# Patient Record
Sex: Female | Born: 1940 | Race: White | Hispanic: No | Marital: Married | Smoking: Former smoker
Health system: Southern US, Community
[De-identification: ages and names within clinical notes are randomized; demographics above are authoritative.]

## PROBLEM LIST (undated history)

## (undated) DIAGNOSIS — E039 Hypothyroidism, unspecified: Secondary | ICD-10-CM

## (undated) DIAGNOSIS — N189 Chronic kidney disease, unspecified: Secondary | ICD-10-CM

## (undated) DIAGNOSIS — C4490 Unspecified malignant neoplasm of skin, unspecified: Secondary | ICD-10-CM

## (undated) DIAGNOSIS — Z889 Allergy status to unspecified drugs, medicaments and biological substances status: Secondary | ICD-10-CM

## (undated) DIAGNOSIS — Z8669 Personal history of other diseases of the nervous system and sense organs: Secondary | ICD-10-CM

## (undated) DIAGNOSIS — I10 Essential (primary) hypertension: Secondary | ICD-10-CM

## (undated) DIAGNOSIS — E7800 Pure hypercholesterolemia, unspecified: Secondary | ICD-10-CM

## (undated) DIAGNOSIS — N329 Bladder disorder, unspecified: Secondary | ICD-10-CM

## (undated) DIAGNOSIS — K635 Polyp of colon: Secondary | ICD-10-CM

## (undated) HISTORY — PX: UPPER GI ENDOSCOPY: SHX6162

## (undated) HISTORY — DX: Unspecified malignant neoplasm of skin, unspecified: C44.90

## (undated) HISTORY — PX: TUBAL LIGATION: SHX77

## (undated) HISTORY — PX: COLONOSCOPY W/ POLYPECTOMY: SHX1380

## (undated) HISTORY — DX: Chronic kidney disease, unspecified: N18.9

---

## 1971-02-22 HISTORY — PX: TUBAL LIGATION: SHX77

## 1971-02-22 HISTORY — PX: APPENDECTOMY: SHX54

## 2000-06-12 ENCOUNTER — Ambulatory Visit (HOSPITAL_COMMUNITY): Admission: RE | Admit: 2000-06-12 | Discharge: 2000-06-12 | Payer: Self-pay | Admitting: Internal Medicine

## 2000-10-16 ENCOUNTER — Encounter: Payer: Self-pay | Admitting: Family Medicine

## 2000-10-16 ENCOUNTER — Ambulatory Visit (HOSPITAL_COMMUNITY): Admission: RE | Admit: 2000-10-16 | Discharge: 2000-10-16 | Payer: Self-pay | Admitting: Family Medicine

## 2000-10-26 ENCOUNTER — Ambulatory Visit (HOSPITAL_COMMUNITY): Admission: RE | Admit: 2000-10-26 | Discharge: 2000-10-26 | Payer: Self-pay | Admitting: Family Medicine

## 2000-10-26 ENCOUNTER — Encounter: Payer: Self-pay | Admitting: Family Medicine

## 2000-11-21 ENCOUNTER — Encounter: Payer: Self-pay | Admitting: Family Medicine

## 2000-11-21 ENCOUNTER — Ambulatory Visit (HOSPITAL_COMMUNITY): Admission: RE | Admit: 2000-11-21 | Discharge: 2000-11-21 | Payer: Self-pay | Admitting: Family Medicine

## 2001-04-10 ENCOUNTER — Ambulatory Visit (HOSPITAL_COMMUNITY): Admission: RE | Admit: 2001-04-10 | Discharge: 2001-04-10 | Payer: Self-pay | Admitting: Family Medicine

## 2001-04-10 ENCOUNTER — Encounter: Payer: Self-pay | Admitting: Family Medicine

## 2001-04-13 ENCOUNTER — Ambulatory Visit (HOSPITAL_COMMUNITY): Admission: RE | Admit: 2001-04-13 | Discharge: 2001-04-13 | Payer: Self-pay | Admitting: Family Medicine

## 2001-04-13 ENCOUNTER — Encounter: Payer: Self-pay | Admitting: Family Medicine

## 2001-04-27 ENCOUNTER — Ambulatory Visit (HOSPITAL_COMMUNITY): Admission: RE | Admit: 2001-04-27 | Discharge: 2001-04-27 | Payer: Self-pay | Admitting: Internal Medicine

## 2001-11-21 ENCOUNTER — Ambulatory Visit (HOSPITAL_COMMUNITY): Admission: RE | Admit: 2001-11-21 | Discharge: 2001-11-21 | Payer: Self-pay | Admitting: Family Medicine

## 2001-11-21 ENCOUNTER — Encounter: Payer: Self-pay | Admitting: Family Medicine

## 2002-05-23 ENCOUNTER — Ambulatory Visit (HOSPITAL_COMMUNITY): Admission: RE | Admit: 2002-05-23 | Discharge: 2002-05-23 | Payer: Self-pay | Admitting: Family Medicine

## 2002-05-23 ENCOUNTER — Encounter: Payer: Self-pay | Admitting: Family Medicine

## 2003-07-29 ENCOUNTER — Ambulatory Visit (HOSPITAL_COMMUNITY): Admission: RE | Admit: 2003-07-29 | Discharge: 2003-07-29 | Payer: Self-pay | Admitting: Family Medicine

## 2004-12-16 ENCOUNTER — Ambulatory Visit (HOSPITAL_COMMUNITY): Admission: RE | Admit: 2004-12-16 | Discharge: 2004-12-16 | Payer: Self-pay | Admitting: Family Medicine

## 2006-04-25 ENCOUNTER — Ambulatory Visit: Payer: Self-pay | Admitting: Internal Medicine

## 2006-04-25 ENCOUNTER — Ambulatory Visit (HOSPITAL_COMMUNITY): Admission: RE | Admit: 2006-04-25 | Discharge: 2006-04-25 | Payer: Self-pay | Admitting: Internal Medicine

## 2006-05-09 ENCOUNTER — Ambulatory Visit (HOSPITAL_COMMUNITY): Admission: RE | Admit: 2006-05-09 | Discharge: 2006-05-09 | Payer: Self-pay | Admitting: Internal Medicine

## 2007-05-10 ENCOUNTER — Ambulatory Visit (HOSPITAL_COMMUNITY): Admission: RE | Admit: 2007-05-10 | Discharge: 2007-05-10 | Payer: Self-pay | Admitting: Family Medicine

## 2007-10-09 ENCOUNTER — Ambulatory Visit (HOSPITAL_COMMUNITY): Admission: RE | Admit: 2007-10-09 | Discharge: 2007-10-09 | Payer: Self-pay | Admitting: Family Medicine

## 2009-08-03 ENCOUNTER — Ambulatory Visit (HOSPITAL_COMMUNITY): Admission: RE | Admit: 2009-08-03 | Discharge: 2009-08-03 | Payer: Self-pay | Admitting: Internal Medicine

## 2010-03-13 ENCOUNTER — Encounter: Payer: Self-pay | Admitting: Family Medicine

## 2010-07-09 NOTE — Op Note (Signed)
NAME:  [name], UN                  ACCOUNT NO.:  [id number]   MEDICAL RECORD NO.:  [id number]          PATIENT TYPE:  AMB   LOCATION:  DAY                           FACILITY:  [facility]   PHYSICIAN:  Lionel December, M.D.    DATE OF BIRTH:  [birth year]   DATE OF PROCEDURE:  04/25/2006  DATE OF DISCHARGE:                               OPERATIVE REPORT   PROCEDURE:  Colonoscopy.   INDICATION:  Alexandria Ellison is a 70 year old Caucasian female with a history of  colonic polyps and a family history of colon carcinoma who is here for  surveillance examination.  Her last exam was 5 years ago.  She has  occasional hematochezia felt to be secondary to hemorrhoids.  Procedure  risks were reviewed with the patient and informed consent was obtained.   MEDICATIONS FOR CONSCIOUS SEDATION:  Demerol 50 mg IV Versed 5 mg IV.   FINDINGS:  Procedure performed in endoscopy suite.  The patient's vital  signs and O2 sat were monitored during the procedure and remained  stable.  The patient was placed in left lateral position and rectal  examination performed.  No abnormality noted external or digital exam.  The Pentax videoscope was placed in the rectum and advanced under vision  into sigmoid colon beyond.  Redundant colon with excellent prep.  Using  abdominal pressure and changing of position from left side to her back  was very helpful.  Scope was advanced to the cecum which was identified  by appendiceal orifice and the ileocecal valve.  Pictures taken for the  record.  As the scope was withdrawn, colonic mucosa was once again  carefully examined and there were no polyps or other mucosal  abnormalities.  Rectal mucosa was normal.  Scope was retroflexed to  examine anorectal junction and small hemorrhoids noted below the dentate  line.  Endoscope was then withdrawn.  The patient tolerated the  procedure well.   FINAL DIAGNOSIS:  1. Redundant but normal colon.  2. Small external hemorrhoids.   RECOMMENDATIONS:   She should continue yearly Hemoccults and consider  next exam in 5 years from now.      Lionel December, M.D.  Electronically Signed    NR/MEDQ  D:  04/25/2006  T:  04/25/2006  Job:  [id number]   cc:   Alexandria Ellison, M.D.  Fax: [phone]

## 2010-07-22 ENCOUNTER — Other Ambulatory Visit (HOSPITAL_COMMUNITY): Payer: Self-pay | Admitting: Internal Medicine

## 2010-07-22 DIAGNOSIS — Z139 Encounter for screening, unspecified: Secondary | ICD-10-CM

## 2010-08-06 ENCOUNTER — Ambulatory Visit (HOSPITAL_COMMUNITY)
Admission: RE | Admit: 2010-08-06 | Discharge: 2010-08-06 | Disposition: A | Discharge status: Home / Self Care | Payer: Medicare Other | Source: Ambulatory Visit | Attending: Internal Medicine | Admitting: Internal Medicine

## 2010-08-06 DIAGNOSIS — Z139 Encounter for screening, unspecified: Secondary | ICD-10-CM

## 2010-08-06 DIAGNOSIS — Z1231 Encounter for screening mammogram for malignant neoplasm of breast: Secondary | ICD-10-CM | POA: Insufficient documentation

## 2010-09-22 DEATH — deceased

## 2011-04-19 ENCOUNTER — Encounter (INDEPENDENT_AMBULATORY_CARE_PROVIDER_SITE_OTHER): Payer: Self-pay | Admitting: *Deleted

## 2011-05-02 ENCOUNTER — Other Ambulatory Visit (INDEPENDENT_AMBULATORY_CARE_PROVIDER_SITE_OTHER): Payer: Self-pay | Admitting: *Deleted

## 2011-05-02 ENCOUNTER — Encounter (INDEPENDENT_AMBULATORY_CARE_PROVIDER_SITE_OTHER): Payer: Self-pay | Admitting: *Deleted

## 2011-05-02 ENCOUNTER — Telehealth (INDEPENDENT_AMBULATORY_CARE_PROVIDER_SITE_OTHER): Payer: Self-pay | Admitting: *Deleted

## 2011-05-02 DIAGNOSIS — Z800 Family history of malignant neoplasm of digestive organs: Secondary | ICD-10-CM

## 2011-05-02 DIAGNOSIS — Z86010 Personal history of colonic polyps: Secondary | ICD-10-CM

## 2011-05-02 NOTE — Telephone Encounter (Signed)
Patient needs movi prep 

## 2011-05-03 MED ORDER — PEG-KCL-NACL-NASULF-NA ASC-C 100 G PO SOLR: 1.0000 | Freq: Once | ORAL | Status: DC

## 2011-05-30 ENCOUNTER — Encounter (INDEPENDENT_AMBULATORY_CARE_PROVIDER_SITE_OTHER): Payer: Self-pay | Admitting: *Deleted

## 2011-06-22 ENCOUNTER — Telehealth (INDEPENDENT_AMBULATORY_CARE_PROVIDER_SITE_OTHER): Payer: Self-pay | Admitting: *Deleted

## 2011-06-22 NOTE — Telephone Encounter (Signed)
PCP/Requesting MD: fusco  Name & DOB: Alexandria Ellison 10/04/1940     Procedure: tcs  Reason/Indication:  Fh crc, hx polyps  Has patient had this procedure before?  yes  If so, when, by whom and where?  3/08  Is there a family history of colon cancer?  yes  Who?  What age when diagnosed?  father  Is patient diabetic?   no      Does patient have prosthetic heart valve?  no  Do you have a pacemaker?  no  Has patient had joint replacement within last 12 months?  no  Is patient on Coumadin, Plavix and/or Aspirin? no  Medications: synthroid 100 mcg daily, triamterene/hctz 37.5/25 mg daily, vitamins  Allergies: nkda  Medication Adjustment: none  Procedure date & time: 07/13/11

## 2011-06-23 NOTE — Telephone Encounter (Signed)
agree

## 2011-07-06 MED ORDER — HYDROCORTISONE ACETATE 25 MG RE SUPP
RECTAL | Status: AC
  Filled 2011-07-06: qty 1

## 2011-07-11 ENCOUNTER — Encounter (HOSPITAL_COMMUNITY): Payer: Self-pay | Admitting: Pharmacy Technician

## 2011-07-12 MED ORDER — SODIUM CHLORIDE 0.45 % IV SOLN
Freq: Once | INTRAVENOUS | Status: AC
  Administered 2011-07-13: 09:00:00 via INTRAVENOUS

## 2011-07-13 ENCOUNTER — Encounter (HOSPITAL_COMMUNITY)
Admission: RE | Disposition: A | Discharge status: Home / Self Care | Payer: Self-pay | Source: Ambulatory Visit | Attending: Internal Medicine

## 2011-07-13 ENCOUNTER — Ambulatory Visit (HOSPITAL_COMMUNITY)
Admission: RE | Admit: 2011-07-13 | Discharge: 2011-07-13 | Disposition: A | Discharge status: Home / Self Care | Payer: Medicare Other | Source: Ambulatory Visit | Attending: Internal Medicine | Admitting: Internal Medicine

## 2011-07-13 ENCOUNTER — Encounter (HOSPITAL_COMMUNITY): Payer: Self-pay | Admitting: *Deleted

## 2011-07-13 DIAGNOSIS — Z860100 Personal history of colon polyps, unspecified: Secondary | ICD-10-CM | POA: Insufficient documentation

## 2011-07-13 DIAGNOSIS — K644 Residual hemorrhoidal skin tags: Secondary | ICD-10-CM

## 2011-07-13 DIAGNOSIS — Z800 Family history of malignant neoplasm of digestive organs: Secondary | ICD-10-CM | POA: Insufficient documentation

## 2011-07-13 DIAGNOSIS — Z09 Encounter for follow-up examination after completed treatment for conditions other than malignant neoplasm: Secondary | ICD-10-CM | POA: Insufficient documentation

## 2011-07-13 DIAGNOSIS — Z79899 Other long term (current) drug therapy: Secondary | ICD-10-CM | POA: Insufficient documentation

## 2011-07-13 HISTORY — DX: Pure hypercholesterolemia, unspecified: E78.00

## 2011-07-13 HISTORY — DX: Polyp of colon: K63.5

## 2011-07-13 HISTORY — PX: COLONOSCOPY: SHX5424

## 2011-07-13 HISTORY — DX: Essential (primary) hypertension: I10

## 2011-07-13 HISTORY — DX: Allergy status to unspecified drugs, medicaments and biological substances: Z88.9

## 2011-07-13 HISTORY — DX: Hypothyroidism, unspecified: E03.9

## 2011-07-13 SURGERY — COLONOSCOPY
Anesthesia: Moderate Sedation

## 2011-07-13 MED ORDER — MEPERIDINE HCL 50 MG/ML IJ SOLN
INTRAMUSCULAR | Status: DC | PRN
  Administered 2011-07-13 (×2): 25 mg via INTRAVENOUS

## 2011-07-13 MED ORDER — MIDAZOLAM HCL 5 MG/5ML IJ SOLN
INTRAMUSCULAR | Status: AC
  Filled 2011-07-13: qty 10

## 2011-07-13 MED ORDER — MIDAZOLAM HCL 5 MG/5ML IJ SOLN
INTRAMUSCULAR | Status: DC | PRN
  Administered 2011-07-13 (×2): 2 mg via INTRAVENOUS
  Administered 2011-07-13: 1 mg via INTRAVENOUS

## 2011-07-13 MED ORDER — STERILE WATER FOR IRRIGATION IR SOLN
Status: DC | PRN
  Administered 2011-07-13: 10:00:00

## 2011-07-13 MED ORDER — MEPERIDINE HCL 50 MG/ML IJ SOLN
INTRAMUSCULAR | Status: AC
  Filled 2011-07-13: qty 1

## 2011-07-13 NOTE — Discharge Instructions (Signed)
Resume usual medications and diet. No driving for 24 hours. Next colonoscopy in 5 years.Hemorrhoids Hemorrhoids are enlarged (dilated) veins around the rectum. There are 2 types of hemorrhoids, and the type of hemorrhoid is determined by its location. Internal hemorrhoids occur in the veins just inside the rectum.They are usually not painful, but they may bleed.However, they may poke through to the outside and become irritated and painful. External hemorrhoids involve the veins outside the anus and can be felt as a painful swelling or hard lump near the anus.They are often itchy and may crack and bleed. Sometimes clots will form in the veins. This makes them swollen and painful. These are called thrombosed hemorrhoids. CAUSES Causes of hemorrhoids include:  Pregnancy. This increases the pressure in the hemorrhoidal veins.   Constipation.   Straining to have a bowel movement.   Obesity.   Heavy lifting or other activity that caused you to strain.  TREATMENT Most of the time hemorrhoids improve in 1 to 2 weeks. However, if symptoms do not seem to be getting better or if you have a lot of rectal bleeding, your caregiver may perform a procedure to help make the hemorrhoids get smaller or remove them completely.Possible treatments include:  Rubber band ligation. A rubber band is placed at the base of the hemorrhoid to cut off the circulation.   Sclerotherapy. A chemical is injected to shrink the hemorrhoid.   Infrared light therapy. Tools are used to burn the hemorrhoid.   Hemorrhoidectomy. This is surgical removal of the hemorrhoid.  HOME CARE INSTRUCTIONS   Increase fiber in your diet. Ask your caregiver about using fiber supplements.   Drink enough water and fluids to keep your urine clear or pale yellow.   Exercise regularly.   Go to the bathroom when you have the urge to have a bowel movement. Do not wait.   Avoid straining to have bowel movements.   Keep the anal area dry  and clean.   Only take over-the-counter or prescription medicines for pain, discomfort, or fever as directed by your caregiver.  If your hemorrhoids are thrombosed:  Take warm sitz baths for 20 to 30 minutes, 3 to 4 times per day.   If the hemorrhoids are very tender and swollen, place ice packs on the area as tolerated. Using ice packs between sitz baths may be helpful. Fill a plastic bag with ice. Place a towel between the bag of ice and your skin.   Medicated creams and suppositories may be used or applied as directed.   Do not use a donut-shaped pillow or sit on the toilet for long periods. This increases blood pooling and pain.  SEEK MEDICAL CARE IF:   You have increasing pain and swelling that is not controlled with your medicine.   You have uncontrolled bleeding.   You have difficulty or you are unable to have a bowel movement.   You have pain or inflammation outside the area of the hemorrhoids.   You have chills or an oral temperature above 102 F (38.9 C).  MAKE SURE YOU:   Understand these instructions.   Will watch your condition.   Will get help right away if you are not doing well or get worse.  Document Released: 02/05/2000 Document Revised: 01/27/2011 Document Reviewed: 06/12/2007 [facility] Patient Information 2012 [place], [place].Colonoscopy Care After Read the instructions outlined below and refer to this sheet in the next few weeks. These discharge instructions provide you with general information on caring for yourself after you  leave the hospital. Your doctor may also give you specific instructions. While your treatment has been planned according to the most current medical practices available, unavoidable complications occasionally occur. If you have any problems or questions after discharge, call your doctor. HOME CARE INSTRUCTIONS ACTIVITY:  You may resume your regular activity, but move at a slower pace for the next 24 hours.   Take frequent rest periods  for the next 24 hours.   Walking will help get rid of the air and reduce the bloated feeling in your belly (abdomen).   No driving for 24 hours (because of the medicine (anesthesia) used during the test).   You may shower.   Do not sign any important legal documents or operate any machinery for 24 hours (because of the anesthesia used during the test).  NUTRITION:  Drink plenty of fluids.   You may resume your normal diet as instructed by your doctor.   Begin with a light meal and progress to your normal diet. Heavy or fried foods are harder to digest and may make you feel sick to your stomach (nauseated).   Avoid alcoholic beverages for 24 hours or as instructed.  MEDICATIONS:  You may resume your normal medications unless your doctor tells you otherwise.  WHAT TO EXPECT TODAY:  Some feelings of bloating in the abdomen.   Passage of more gas than usual.   Spotting of blood in your stool or on the toilet paper.  IF YOU HAD POLYPS REMOVED DURING THE COLONOSCOPY:  No aspirin products for 7 days or as instructed.   No alcohol for 7 days or as instructed.   Eat a soft diet for the next 24 hours.  FINDING OUT THE RESULTS OF YOUR TEST Not all test results are available during your visit. If your test results are not back during the visit, make an appointment with your caregiver to find out the results. Do not assume everything is normal if you have not heard from your caregiver or the medical facility. It is important for you to follow up on all of your test results.  SEEK IMMEDIATE MEDICAL CARE IF:  You have more than a spotting of blood in your stool.   Your belly is swollen (abdominal distention).   You are nauseated or vomiting.   You have a fever.   You have abdominal pain or discomfort that is severe or gets worse throughout the day.  Document Released: 09/22/2003 Document Revised: 01/27/2011 Document Reviewed: 09/20/2007 [facility] Patient Information 2012 [place],  [place].

## 2011-07-13 NOTE — H&P (Signed)
Alexandria Ellison is an 71 y.o. female.   Chief Complaint: Patient is here for colonoscopy. HPI: Patient is 71 year old Caucasian female who is here for surveillance colonoscopy. She has history of colonic polyps. Last exam in March 2000 and was negative for polyps to. She said adenomas and had accidents. She denies abdominal pain change in habits or rectal bleeding. Family history significant for colon carcinoma father in his 38s and mother to gastric carcinoma.  Past Medical History  Diagnosis Date  . Hypertension   . Hypothyroidism   . Hypercholesteremia   . Pneumothorax     Spontaneous,at age 51  . H/O seasonal allergies   . Colon polyps     Past Surgical History  Procedure Date  . Colonoscopy w/ polypectomy   . Tubal ligation     Family History  Problem Relation Age of Onset  . Stomach cancer Mother   . Colon cancer Father    Social History:  reports that she has quit smoking. She does not have any smokeless tobacco history on file. She reports that she does not drink alcohol or use illicit drugs.  Allergies:  Allergies  Allergen Reactions  . Aspirin Palpitations    No reaction with low dose 81 mg aspirin  . Phenobarbital Palpitations    Medications Prior to Admission  Medication Sig Dispense Refill  . B Complex-C (B-COMPLEX WITH VITAMIN C) tablet Take 1 tablet by mouth every morning.      . Clobetasol Propionate (TEMOVATE) 0.05 % external spray Apply 1 application topically daily as needed. For flares      . fish oil-omega-3 fatty acids 1000 MG capsule Take 1 g by mouth 2 (two) times daily.      Marland Kitchen levothyroxine (SYNTHROID, LEVOTHROID) 100 MCG tablet Take 100 mcg by mouth daily before breakfast.      . loratadine (CLARITIN) 10 MG tablet Take 10 mg by mouth daily as needed. For allergies      . Multiple Vitamin (MULITIVITAMIN WITH MINERALS) TABS Take 1 tablet by mouth daily.      . naphazoline-pheniramine (NAPHCON-A) 0.025-0.3 % ophthalmic solution Place 1 drop into both  eyes 4 (four) times daily as needed. For watery eyes      . peg 3350 powder (MOVIPREP) 100 G SOLR Take 1 kit (100 g total) by mouth once.  1 kit  0  . PRESCRIPTION MEDICATION Apply 1 application topically daily as needed. Compounded salicylic acid 30% in petroleum  Apply to feet as needed      . triamterene-hydrochlorothiazide (MAXZIDE-25) 37.5-25 MG per tablet Take 1 tablet by mouth every morning.        No results found for this or any previous visit (from the past 48 hour(s)). No results found.  ROS  Blood pressure 136/76, pulse 62, temperature 98.3 F (36.8 C), temperature source Oral, resp. rate 12, height 5\' 5"  (1.651 m), weight 176 lb (79.833 kg), SpO2 100.00%. Physical Exam  Constitutional: She appears well-developed and well-nourished.  HENT:  Mouth/Throat: Oropharynx is clear and moist.  Eyes: Conjunctivae are normal. No scleral icterus.  Neck: No thyromegaly present.  Cardiovascular: Normal rate, regular rhythm and normal heart sounds.   No murmur heard. Respiratory: Effort normal and breath sounds normal.  GI: Soft. She exhibits no distension and no mass. There is no tenderness.  Musculoskeletal: She exhibits no edema.  Lymphadenopathy:    She has no cervical adenopathy.  Neurological: She is alert.  Skin: Skin is warm.     Assessment/Plan  History of colonic polyps. Family history of colon carcinoma ina parent(late onset). Surveillance colonoscopy  Alexandria Ellison U 07/13/2011, 9:43 AM

## 2011-07-13 NOTE — Op Note (Signed)
COLONOSCOPY PROCEDURE REPORT  PATIENT:  Alexandria Ellison  MR#:  [id number] Birthdate:  01-Oct-1940, 71 y.o., female Endoscopist:  Dr. Malissa Hippo, MD Referred By:  Dr. Madelin Rear. Sherwood Gambler, MD Procedure Date: 07/13/2011  Procedure:   Colonoscopy  Indications:  Patient is 71 year old Caucasian female with history of colonic adenomas and family she of colon carcinoma father at late onset. She is undergoing surveillance colonoscopy.  Informed Consent:  The procedure and risks were reviewed with the patient and informed consent was obtained.  Medications:  Demerol 50 mg IV Versed 5 mg IV  Description of procedure:  After a digital rectal exam was performed, that colonoscope was advanced from the anus through the rectum and colon to the area of the cecum, ileocecal valve and appendiceal orifice. The cecum was deeply intubated. These structures were well-seen and photographed for the record. From the level of the cecum and ileocecal valve, the scope was slowly and cautiously withdrawn. The mucosal surfaces were carefully surveyed utilizing scope tip to flexion to facilitate fold flattening as needed. The scope was pulled down into the rectum where a thorough exam including retroflexion was performed.  Findings:   Prep excellent. Normal mucosa throughout. Normal rectal mucosa. Small hemorrhoids below the dentate line.  Therapeutic/Diagnostic Maneuvers Performed:  None  Complications:  None  Cecal Withdrawal Time:  7 minutes  Impression:  Normal colonoscopy except small external hemorrhoids.  Recommendations:  Standard instructions given. Next colonoscopy in 5 year  Elzie Sheets U  07/13/2011 10:13 AM  CC: Dr. Cassell Smiles., MD, MD & Dr. Bonnetta Barry ref. provider found

## 2011-07-15 ENCOUNTER — Encounter (HOSPITAL_COMMUNITY): Payer: Self-pay | Admitting: Internal Medicine

## 2011-08-10 DIAGNOSIS — H40009 Preglaucoma, unspecified, unspecified eye: Secondary | ICD-10-CM | POA: Diagnosis not present

## 2011-08-10 DIAGNOSIS — H2510 Age-related nuclear cataract, unspecified eye: Secondary | ICD-10-CM | POA: Diagnosis not present

## 2011-08-15 ENCOUNTER — Other Ambulatory Visit (HOSPITAL_COMMUNITY): Payer: Self-pay | Admitting: Internal Medicine

## 2011-08-15 DIAGNOSIS — Z01419 Encounter for gynecological examination (general) (routine) without abnormal findings: Secondary | ICD-10-CM

## 2011-08-15 DIAGNOSIS — Z0000 Encounter for general adult medical examination without abnormal findings: Secondary | ICD-10-CM | POA: Diagnosis not present

## 2011-08-15 DIAGNOSIS — E039 Hypothyroidism, unspecified: Secondary | ICD-10-CM | POA: Diagnosis not present

## 2011-08-15 DIAGNOSIS — I10 Essential (primary) hypertension: Secondary | ICD-10-CM | POA: Diagnosis not present

## 2011-08-15 DIAGNOSIS — Z139 Encounter for screening, unspecified: Secondary | ICD-10-CM

## 2011-08-15 DIAGNOSIS — Z6831 Body mass index (BMI) 31.0-31.9, adult: Secondary | ICD-10-CM | POA: Diagnosis not present

## 2011-08-18 ENCOUNTER — Ambulatory Visit (HOSPITAL_COMMUNITY)
Admission: RE | Admit: 2011-08-18 | Discharge: 2011-08-18 | Disposition: A | Discharge status: Home / Self Care | Payer: Medicare Other | Source: Ambulatory Visit | Attending: Internal Medicine | Admitting: Internal Medicine

## 2011-08-18 DIAGNOSIS — Z1231 Encounter for screening mammogram for malignant neoplasm of breast: Secondary | ICD-10-CM | POA: Diagnosis not present

## 2011-08-18 DIAGNOSIS — Z79899 Other long term (current) drug therapy: Secondary | ICD-10-CM | POA: Diagnosis not present

## 2011-08-18 DIAGNOSIS — Z139 Encounter for screening, unspecified: Secondary | ICD-10-CM

## 2011-08-18 DIAGNOSIS — Z01419 Encounter for gynecological examination (general) (routine) without abnormal findings: Secondary | ICD-10-CM | POA: Diagnosis not present

## 2012-02-02 DIAGNOSIS — L820 Inflamed seborrheic keratosis: Secondary | ICD-10-CM | POA: Diagnosis not present

## 2012-02-02 DIAGNOSIS — D0470 Carcinoma in situ of skin of unspecified lower limb, including hip: Secondary | ICD-10-CM | POA: Diagnosis not present

## 2012-02-02 DIAGNOSIS — C44711 Basal cell carcinoma of skin of unspecified lower limb, including hip: Secondary | ICD-10-CM | POA: Diagnosis not present

## 2012-02-02 DIAGNOSIS — L259 Unspecified contact dermatitis, unspecified cause: Secondary | ICD-10-CM | POA: Diagnosis not present

## 2012-08-02 ENCOUNTER — Other Ambulatory Visit (HOSPITAL_COMMUNITY): Payer: Self-pay | Admitting: Internal Medicine

## 2012-08-02 DIAGNOSIS — Z139 Encounter for screening, unspecified: Secondary | ICD-10-CM

## 2012-08-20 ENCOUNTER — Ambulatory Visit (HOSPITAL_COMMUNITY)
Admission: RE | Admit: 2012-08-20 | Discharge: 2012-08-20 | Disposition: A | Discharge status: Home / Self Care | Payer: Medicare Other | Source: Ambulatory Visit | Attending: Internal Medicine | Admitting: Internal Medicine

## 2012-08-20 DIAGNOSIS — Z1231 Encounter for screening mammogram for malignant neoplasm of breast: Secondary | ICD-10-CM | POA: Insufficient documentation

## 2012-08-20 DIAGNOSIS — Z139 Encounter for screening, unspecified: Secondary | ICD-10-CM

## 2012-09-17 DIAGNOSIS — Z0000 Encounter for general adult medical examination without abnormal findings: Secondary | ICD-10-CM | POA: Diagnosis not present

## 2012-09-17 DIAGNOSIS — Z6831 Body mass index (BMI) 31.0-31.9, adult: Secondary | ICD-10-CM | POA: Diagnosis not present

## 2012-09-17 DIAGNOSIS — E039 Hypothyroidism, unspecified: Secondary | ICD-10-CM | POA: Diagnosis not present

## 2012-09-17 DIAGNOSIS — I10 Essential (primary) hypertension: Secondary | ICD-10-CM | POA: Diagnosis not present

## 2012-09-18 DIAGNOSIS — Z0000 Encounter for general adult medical examination without abnormal findings: Secondary | ICD-10-CM | POA: Diagnosis not present

## 2012-09-18 DIAGNOSIS — Z79899 Other long term (current) drug therapy: Secondary | ICD-10-CM | POA: Diagnosis not present

## 2013-07-04 DIAGNOSIS — L219 Seborrheic dermatitis, unspecified: Secondary | ICD-10-CM | POA: Diagnosis not present

## 2013-07-04 DIAGNOSIS — L570 Actinic keratosis: Secondary | ICD-10-CM | POA: Diagnosis not present

## 2013-07-04 DIAGNOSIS — C44721 Squamous cell carcinoma of skin of unspecified lower limb, including hip: Secondary | ICD-10-CM | POA: Diagnosis not present

## 2013-07-04 DIAGNOSIS — D0460 Carcinoma in situ of skin of unspecified upper limb, including shoulder: Secondary | ICD-10-CM | POA: Diagnosis not present

## 2013-08-07 DIAGNOSIS — Z85828 Personal history of other malignant neoplasm of skin: Secondary | ICD-10-CM | POA: Diagnosis not present

## 2013-08-07 DIAGNOSIS — C44721 Squamous cell carcinoma of skin of unspecified lower limb, including hip: Secondary | ICD-10-CM | POA: Diagnosis not present

## 2013-10-01 ENCOUNTER — Other Ambulatory Visit (HOSPITAL_COMMUNITY): Payer: Self-pay | Admitting: Internal Medicine

## 2013-10-01 DIAGNOSIS — Z0000 Encounter for general adult medical examination without abnormal findings: Secondary | ICD-10-CM | POA: Diagnosis not present

## 2013-10-01 DIAGNOSIS — Z6831 Body mass index (BMI) 31.0-31.9, adult: Secondary | ICD-10-CM | POA: Diagnosis not present

## 2013-10-01 DIAGNOSIS — Z1231 Encounter for screening mammogram for malignant neoplasm of breast: Secondary | ICD-10-CM

## 2013-10-01 DIAGNOSIS — F3289 Other specified depressive episodes: Secondary | ICD-10-CM | POA: Diagnosis not present

## 2013-10-01 DIAGNOSIS — E039 Hypothyroidism, unspecified: Secondary | ICD-10-CM | POA: Diagnosis not present

## 2013-10-01 DIAGNOSIS — I10 Essential (primary) hypertension: Secondary | ICD-10-CM | POA: Diagnosis not present

## 2013-10-01 DIAGNOSIS — F329 Major depressive disorder, single episode, unspecified: Secondary | ICD-10-CM | POA: Diagnosis not present

## 2013-10-04 ENCOUNTER — Ambulatory Visit (HOSPITAL_COMMUNITY)
Admission: RE | Admit: 2013-10-04 | Discharge: 2013-10-04 | Disposition: A | Discharge status: Home / Self Care | Payer: Medicare Other | Source: Ambulatory Visit | Attending: Internal Medicine | Admitting: Internal Medicine

## 2013-10-04 DIAGNOSIS — Z1231 Encounter for screening mammogram for malignant neoplasm of breast: Secondary | ICD-10-CM | POA: Diagnosis not present

## 2014-06-16 ENCOUNTER — Emergency Department
Admit: 2014-06-16 | Disposition: A | Discharge status: Home / Self Care | Payer: Self-pay | Admitting: Emergency Medicine

## 2014-06-16 DIAGNOSIS — M25461 Effusion, right knee: Secondary | ICD-10-CM | POA: Diagnosis not present

## 2014-06-16 DIAGNOSIS — I10 Essential (primary) hypertension: Secondary | ICD-10-CM | POA: Diagnosis not present

## 2014-06-16 DIAGNOSIS — S8991XA Unspecified injury of right lower leg, initial encounter: Secondary | ICD-10-CM | POA: Diagnosis not present

## 2014-06-16 DIAGNOSIS — S8391XA Sprain of unspecified site of right knee, initial encounter: Secondary | ICD-10-CM | POA: Diagnosis not present

## 2014-07-10 DIAGNOSIS — M2391 Unspecified internal derangement of right knee: Secondary | ICD-10-CM | POA: Diagnosis not present

## 2014-07-10 DIAGNOSIS — M25561 Pain in right knee: Secondary | ICD-10-CM | POA: Diagnosis not present

## 2014-10-16 ENCOUNTER — Other Ambulatory Visit (HOSPITAL_COMMUNITY): Payer: Self-pay | Admitting: Internal Medicine

## 2014-10-16 DIAGNOSIS — F419 Anxiety disorder, unspecified: Secondary | ICD-10-CM | POA: Diagnosis not present

## 2014-10-16 DIAGNOSIS — E669 Obesity, unspecified: Secondary | ICD-10-CM | POA: Diagnosis not present

## 2014-10-16 DIAGNOSIS — E063 Autoimmune thyroiditis: Secondary | ICD-10-CM | POA: Diagnosis not present

## 2014-10-16 DIAGNOSIS — M8580 Other specified disorders of bone density and structure, unspecified site: Secondary | ICD-10-CM

## 2014-10-16 DIAGNOSIS — Z6831 Body mass index (BMI) 31.0-31.9, adult: Secondary | ICD-10-CM | POA: Diagnosis not present

## 2014-10-16 DIAGNOSIS — I10 Essential (primary) hypertension: Secondary | ICD-10-CM | POA: Diagnosis not present

## 2014-10-16 DIAGNOSIS — Z1389 Encounter for screening for other disorder: Secondary | ICD-10-CM | POA: Diagnosis not present

## 2014-10-16 DIAGNOSIS — Z1231 Encounter for screening mammogram for malignant neoplasm of breast: Secondary | ICD-10-CM

## 2014-10-23 ENCOUNTER — Ambulatory Visit (HOSPITAL_COMMUNITY)
Admission: RE | Admit: 2014-10-23 | Discharge: 2014-10-23 | Disposition: A | Discharge status: Home / Self Care | Payer: Medicare Other | Source: Ambulatory Visit | Attending: Internal Medicine | Admitting: Internal Medicine

## 2014-10-23 DIAGNOSIS — M8580 Other specified disorders of bone density and structure, unspecified site: Secondary | ICD-10-CM | POA: Diagnosis not present

## 2014-10-23 DIAGNOSIS — Z1231 Encounter for screening mammogram for malignant neoplasm of breast: Secondary | ICD-10-CM

## 2014-10-23 DIAGNOSIS — M85852 Other specified disorders of bone density and structure, left thigh: Secondary | ICD-10-CM | POA: Diagnosis not present

## 2014-10-23 DIAGNOSIS — Z780 Asymptomatic menopausal state: Secondary | ICD-10-CM | POA: Insufficient documentation

## 2015-09-01 DIAGNOSIS — L400 Psoriasis vulgaris: Secondary | ICD-10-CM | POA: Diagnosis not present

## 2015-09-01 DIAGNOSIS — L821 Other seborrheic keratosis: Secondary | ICD-10-CM | POA: Diagnosis not present

## 2015-09-01 DIAGNOSIS — Z872 Personal history of diseases of the skin and subcutaneous tissue: Secondary | ICD-10-CM | POA: Diagnosis not present

## 2015-09-01 DIAGNOSIS — L570 Actinic keratosis: Secondary | ICD-10-CM | POA: Diagnosis not present

## 2015-09-01 DIAGNOSIS — Z09 Encounter for follow-up examination after completed treatment for conditions other than malignant neoplasm: Secondary | ICD-10-CM | POA: Diagnosis not present

## 2015-09-01 DIAGNOSIS — L309 Dermatitis, unspecified: Secondary | ICD-10-CM | POA: Diagnosis not present

## 2015-09-01 DIAGNOSIS — D239 Other benign neoplasm of skin, unspecified: Secondary | ICD-10-CM | POA: Diagnosis not present

## 2015-09-01 DIAGNOSIS — L280 Lichen simplex chronicus: Secondary | ICD-10-CM | POA: Diagnosis not present

## 2015-09-15 DIAGNOSIS — L400 Psoriasis vulgaris: Secondary | ICD-10-CM | POA: Diagnosis not present

## 2015-09-15 DIAGNOSIS — C44722 Squamous cell carcinoma of skin of right lower limb, including hip: Secondary | ICD-10-CM | POA: Diagnosis not present

## 2015-09-15 DIAGNOSIS — L570 Actinic keratosis: Secondary | ICD-10-CM | POA: Diagnosis not present

## 2015-09-15 DIAGNOSIS — C44729 Squamous cell carcinoma of skin of left lower limb, including hip: Secondary | ICD-10-CM | POA: Diagnosis not present

## 2015-09-29 DIAGNOSIS — C44722 Squamous cell carcinoma of skin of right lower limb, including hip: Secondary | ICD-10-CM | POA: Diagnosis not present

## 2015-09-29 DIAGNOSIS — C44729 Squamous cell carcinoma of skin of left lower limb, including hip: Secondary | ICD-10-CM | POA: Diagnosis not present

## 2015-09-29 DIAGNOSIS — L570 Actinic keratosis: Secondary | ICD-10-CM | POA: Diagnosis not present

## 2015-10-05 DIAGNOSIS — L570 Actinic keratosis: Secondary | ICD-10-CM | POA: Diagnosis not present

## 2015-10-05 DIAGNOSIS — C44729 Squamous cell carcinoma of skin of left lower limb, including hip: Secondary | ICD-10-CM | POA: Diagnosis not present

## 2015-10-05 DIAGNOSIS — C44722 Squamous cell carcinoma of skin of right lower limb, including hip: Secondary | ICD-10-CM | POA: Diagnosis not present

## 2015-10-09 DIAGNOSIS — C44722 Squamous cell carcinoma of skin of right lower limb, including hip: Secondary | ICD-10-CM | POA: Diagnosis not present

## 2015-10-09 DIAGNOSIS — C44729 Squamous cell carcinoma of skin of left lower limb, including hip: Secondary | ICD-10-CM | POA: Diagnosis not present

## 2015-10-09 DIAGNOSIS — L570 Actinic keratosis: Secondary | ICD-10-CM | POA: Diagnosis not present

## 2015-10-13 DIAGNOSIS — L570 Actinic keratosis: Secondary | ICD-10-CM | POA: Diagnosis not present

## 2015-10-13 DIAGNOSIS — D485 Neoplasm of uncertain behavior of skin: Secondary | ICD-10-CM | POA: Diagnosis not present

## 2015-10-13 DIAGNOSIS — L818 Other specified disorders of pigmentation: Secondary | ICD-10-CM | POA: Diagnosis not present

## 2015-10-13 DIAGNOSIS — C44729 Squamous cell carcinoma of skin of left lower limb, including hip: Secondary | ICD-10-CM | POA: Diagnosis not present

## 2015-10-13 DIAGNOSIS — C44722 Squamous cell carcinoma of skin of right lower limb, including hip: Secondary | ICD-10-CM | POA: Diagnosis not present

## 2015-11-10 DIAGNOSIS — L570 Actinic keratosis: Secondary | ICD-10-CM | POA: Diagnosis not present

## 2015-11-10 DIAGNOSIS — Z789 Other specified health status: Secondary | ICD-10-CM | POA: Diagnosis not present

## 2015-11-10 DIAGNOSIS — L298 Other pruritus: Secondary | ICD-10-CM | POA: Diagnosis not present

## 2015-11-10 DIAGNOSIS — Z48817 Encounter for surgical aftercare following surgery on the skin and subcutaneous tissue: Secondary | ICD-10-CM | POA: Diagnosis not present

## 2015-11-10 DIAGNOSIS — C44722 Squamous cell carcinoma of skin of right lower limb, including hip: Secondary | ICD-10-CM | POA: Diagnosis not present

## 2015-11-10 DIAGNOSIS — C44729 Squamous cell carcinoma of skin of left lower limb, including hip: Secondary | ICD-10-CM | POA: Diagnosis not present

## 2015-11-10 DIAGNOSIS — R238 Other skin changes: Secondary | ICD-10-CM | POA: Diagnosis not present

## 2015-11-10 DIAGNOSIS — L538 Other specified erythematous conditions: Secondary | ICD-10-CM | POA: Diagnosis not present

## 2015-11-10 DIAGNOSIS — L820 Inflamed seborrheic keratosis: Secondary | ICD-10-CM | POA: Diagnosis not present

## 2016-02-09 DIAGNOSIS — C44729 Squamous cell carcinoma of skin of left lower limb, including hip: Secondary | ICD-10-CM | POA: Diagnosis not present

## 2016-02-09 DIAGNOSIS — L570 Actinic keratosis: Secondary | ICD-10-CM | POA: Diagnosis not present

## 2016-02-09 DIAGNOSIS — C44722 Squamous cell carcinoma of skin of right lower limb, including hip: Secondary | ICD-10-CM | POA: Diagnosis not present

## 2016-02-22 HISTORY — PX: OTHER SURGICAL HISTORY: SHX169

## 2016-03-23 ENCOUNTER — Encounter: Payer: Self-pay | Admitting: Internal Medicine

## 2016-05-23 DIAGNOSIS — J9311 Primary spontaneous pneumothorax: Secondary | ICD-10-CM | POA: Insufficient documentation

## 2016-05-23 DIAGNOSIS — E039 Hypothyroidism, unspecified: Secondary | ICD-10-CM | POA: Insufficient documentation

## 2016-05-23 DIAGNOSIS — L409 Psoriasis, unspecified: Secondary | ICD-10-CM | POA: Insufficient documentation

## 2016-05-23 DIAGNOSIS — Z85828 Personal history of other malignant neoplasm of skin: Secondary | ICD-10-CM | POA: Insufficient documentation

## 2016-05-23 DIAGNOSIS — E559 Vitamin D deficiency, unspecified: Secondary | ICD-10-CM | POA: Insufficient documentation

## 2016-05-23 DIAGNOSIS — E782 Mixed hyperlipidemia: Secondary | ICD-10-CM | POA: Insufficient documentation

## 2016-05-23 DIAGNOSIS — T7500XA Unspecified effects of lightning, initial encounter: Secondary | ICD-10-CM | POA: Insufficient documentation

## 2016-06-14 DIAGNOSIS — C44729 Squamous cell carcinoma of skin of left lower limb, including hip: Secondary | ICD-10-CM | POA: Diagnosis not present

## 2016-06-14 DIAGNOSIS — D485 Neoplasm of uncertain behavior of skin: Secondary | ICD-10-CM | POA: Diagnosis not present

## 2016-06-14 DIAGNOSIS — C44722 Squamous cell carcinoma of skin of right lower limb, including hip: Secondary | ICD-10-CM | POA: Diagnosis not present

## 2016-06-14 DIAGNOSIS — C44622 Squamous cell carcinoma of skin of right upper limb, including shoulder: Secondary | ICD-10-CM | POA: Diagnosis not present

## 2016-06-14 DIAGNOSIS — L570 Actinic keratosis: Secondary | ICD-10-CM | POA: Diagnosis not present

## 2016-06-27 DIAGNOSIS — C44729 Squamous cell carcinoma of skin of left lower limb, including hip: Secondary | ICD-10-CM | POA: Insufficient documentation

## 2016-06-28 DIAGNOSIS — Z85828 Personal history of other malignant neoplasm of skin: Secondary | ICD-10-CM | POA: Diagnosis not present

## 2016-06-28 DIAGNOSIS — C44729 Squamous cell carcinoma of skin of left lower limb, including hip: Secondary | ICD-10-CM | POA: Diagnosis not present

## 2016-06-28 DIAGNOSIS — L905 Scar conditions and fibrosis of skin: Secondary | ICD-10-CM | POA: Diagnosis not present

## 2016-06-28 DIAGNOSIS — R239 Unspecified skin changes: Secondary | ICD-10-CM | POA: Diagnosis not present

## 2016-06-30 ENCOUNTER — Encounter (INDEPENDENT_AMBULATORY_CARE_PROVIDER_SITE_OTHER): Payer: Self-pay | Admitting: *Deleted

## 2016-10-07 DIAGNOSIS — K648 Other hemorrhoids: Secondary | ICD-10-CM | POA: Insufficient documentation

## 2016-11-15 DIAGNOSIS — A048 Other specified bacterial intestinal infections: Secondary | ICD-10-CM | POA: Insufficient documentation

## 2017-03-09 DIAGNOSIS — N183 Chronic kidney disease, stage 3 unspecified: Secondary | ICD-10-CM | POA: Insufficient documentation

## 2017-05-09 DIAGNOSIS — I10 Essential (primary) hypertension: Secondary | ICD-10-CM | POA: Insufficient documentation

## 2017-05-09 DIAGNOSIS — N179 Acute kidney failure, unspecified: Secondary | ICD-10-CM | POA: Insufficient documentation

## 2018-04-23 DIAGNOSIS — R21 Rash and other nonspecific skin eruption: Secondary | ICD-10-CM | POA: Insufficient documentation

## 2018-09-27 ENCOUNTER — Telehealth: Payer: Self-pay | Admitting: Obstetrics & Gynecology

## 2018-09-27 NOTE — Telephone Encounter (Signed)
Attempted to reach patient by phone to remind her of her appointment and go over restrictions, mailbox is full.

## 2018-10-01 ENCOUNTER — Encounter: Payer: Self-pay | Admitting: Obstetrics & Gynecology

## 2018-10-01 ENCOUNTER — Other Ambulatory Visit: Payer: Self-pay

## 2018-10-01 ENCOUNTER — Other Ambulatory Visit: Payer: Self-pay | Admitting: Obstetrics & Gynecology

## 2018-10-01 ENCOUNTER — Ambulatory Visit (INDEPENDENT_AMBULATORY_CARE_PROVIDER_SITE_OTHER): Payer: Medicare Other | Admitting: Obstetrics & Gynecology

## 2018-10-01 VITALS — BP 130/81 | HR 65 | Ht 65.0 in | Wt 174.0 lb

## 2018-10-01 DIAGNOSIS — N840 Polyp of corpus uteri: Secondary | ICD-10-CM | POA: Diagnosis not present

## 2018-10-01 DIAGNOSIS — N950 Postmenopausal bleeding: Secondary | ICD-10-CM

## 2018-10-01 NOTE — Progress Notes (Signed)
Patient ID: Alexandria Ellison, female   DOB: [birth year], 78 y.o.   MRN: [id number]      Chief Complaint  Patient presents with  . Vaginal Bleeding    when wiped/ pain lower part stomach      78 y.o. [id number] No LMP recorded. Patient is postmenopausal. The current method of family planning is post menopausal status.  Outpatient Encounter Medications as of 10/01/2018  Medication Sig  . B Complex-C (B-COMPLEX WITH VITAMIN C) tablet Take 1 tablet by mouth every morning.  Marland Kitchen levothyroxine (SYNTHROID, LEVOTHROID) 100 MCG tablet Take 100 mcg by mouth daily before breakfast.  . losartan (COZAAR) 100 MG tablet Take 100 mg by mouth daily.  . Multiple Vitamin (MULITIVITAMIN WITH MINERALS) TABS Take 1 tablet by mouth daily.  Marland Kitchen omeprazole (PRILOSEC) 40 MG capsule Take 20 mg by mouth daily.  Marland Kitchen PRESCRIPTION MEDICATION Amlopine Bestylate 2.5 mg at bedtime  . rosuvastatin (CRESTOR) 5 MG tablet Take 5 mg by mouth daily.  Marland Kitchen triamterene-hydrochlorothiazide (MAXZIDE-25) 37.5-25 MG per tablet Take 1 tablet by mouth every morning.  . Clobetasol Propionate (TEMOVATE) 0.05 % external spray Apply 1 application topically daily as needed. For flares  . fish oil-omega-3 fatty acids 1000 MG capsule Take 1 g by mouth 2 (two) times daily.  Marland Kitchen loratadine (CLARITIN) 10 MG tablet Take 10 mg by mouth daily as needed. For allergies  . naphazoline-pheniramine (NAPHCON-A) 0.025-0.3 % ophthalmic solution Place 1 drop into both eyes 4 (four) times daily as needed. For watery eyes  . PRESCRIPTION MEDICATION Apply 1 application topically daily as needed. Compounded salicylic acid 62% in petroleum  Apply to feet as needed   No facility-administered encounter medications on file as of 10/01/2018.     Subjective Pt in middle of July had minimal pink red vaginal spotting and cramping None before that day or since No previous history of PMB On no blood thinners and no trauma Past Medical History:  Diagnosis Date  . Chronic kidney  disease   . Colon polyps   . H/O seasonal allergies   . Hypercholesteremia   . Hypertension   . Hypothyroidism   . Pneumothorax    Spontaneous,at age 15  . Skin cancer     Past Surgical History:  Procedure Laterality Date  . COLONOSCOPY  07/13/2011   Procedure: COLONOSCOPY;  Surgeon: Rogene Houston, MD;  Location: [facility];  Service: Endoscopy;  Laterality: N/A;  730  . COLONOSCOPY W/ POLYPECTOMY    . TUBAL LIGATION      OB History    Gravida  2   Para  2   Term  2   Preterm      AB      Living  2     SAB      TAB      Ectopic      Multiple      Live Births  2           Allergies  Allergen Reactions  . Aspirin Palpitations    No reaction with low dose 81 mg aspirin  . Phenobarbital Palpitations    Social History   Socioeconomic History  . Marital status: Widowed    Spouse name: Not on file  . Number of children: 2  . Years of education: Not on file  . Highest education level: Not on file  Occupational History  . Not on file  Social Needs  . Financial resource strain: Not on file  . Food  insecurity    Worry: Not on file    Inability: Not on file  . Transportation needs    Medical: Not on file    Non-medical: Not on file  Tobacco Use  . Smoking status: Former Smoker    Packs/day: 0.25    Years: 0.50    Pack years: 0.12  . Smokeless tobacco: Former Network engineer and Sexual Activity  . Alcohol use: No  . Drug use: No  . Sexual activity: Yes    Birth control/protection: Surgical  Lifestyle  . Physical activity    Days per week: Not on file    Minutes per session: Not on file  . Stress: Not on file  Relationships  . Social Herbalist on phone: Not on file    Gets together: Not on file    Attends religious service: Not on file    Active member of club or organization: Not on file    Attends meetings of clubs or organizations: Not on file    Relationship status: Not on file  Other Topics Concern  . Not on file   Social History Narrative  . Not on file    Family History  Problem Relation Age of Onset  . Stomach cancer Mother   . Colon cancer Father     Medications:       Current Outpatient Medications:  .  B Complex-C (B-COMPLEX WITH VITAMIN C) tablet, Take 1 tablet by mouth every morning., Disp: , Rfl:  .  levothyroxine (SYNTHROID, LEVOTHROID) 100 MCG tablet, Take 100 mcg by mouth daily before breakfast., Disp: , Rfl:  .  losartan (COZAAR) 100 MG tablet, Take 100 mg by mouth daily., Disp: , Rfl:  .  Multiple Vitamin (MULITIVITAMIN WITH MINERALS) TABS, Take 1 tablet by mouth daily., Disp: , Rfl:  .  omeprazole (PRILOSEC) 40 MG capsule, Take 20 mg by mouth daily., Disp: , Rfl:  .  PRESCRIPTION MEDICATION, Amlopine Bestylate 2.5 mg at bedtime, Disp: , Rfl:  .  rosuvastatin (CRESTOR) 5 MG tablet, Take 5 mg by mouth daily., Disp: , Rfl:  .  triamterene-hydrochlorothiazide (MAXZIDE-25) 37.5-25 MG per tablet, Take 1 tablet by mouth every morning., Disp: , Rfl:  .  Clobetasol Propionate (TEMOVATE) 0.05 % external spray, Apply 1 application topically daily as needed. For flares, Disp: , Rfl:  .  fish oil-omega-3 fatty acids 1000 MG capsule, Take 1 g by mouth 2 (two) times daily., Disp: , Rfl:  .  loratadine (CLARITIN) 10 MG tablet, Take 10 mg by mouth daily as needed. For allergies, Disp: , Rfl:  .  naphazoline-pheniramine (NAPHCON-A) 0.025-0.3 % ophthalmic solution, Place 1 drop into both eyes 4 (four) times daily as needed. For watery eyes, Disp: , Rfl:  .  PRESCRIPTION MEDICATION, Apply 1 application topically daily as needed. Compounded salicylic acid 92% in petroleum  Apply to feet as needed, Disp: , Rfl:   Objective Blood pressure 130/81, pulse 65, height 5\' 5"  (1.651 m), weight 174 lb (78.9 kg).  General WDWN female NAD Vulva:  postmenopausal appearing vulva with no masses, tenderness or lesions Vagina:  normal mucosa, no discharge Cervix:  Normal no lesions Uterus:  normal size, contour,  position, consistency, mobility, non-tender Adnexa: ovaries:present,  normal adnexa in size, nontender and no masses   Pertinent ROS No burning with urination, frequency or urgency No nausea, vomiting or diarrhea Nor fever chills or other constitutional symptoms   Labs or studies EMBx result pending Sonogram ordered  Endometrial Biopsy Procedure Note  Pre-operative Diagnosis: PMB  Post-operative Diagnosis: same  Indications: postmenopausal bleeding  Procedure Details   Urine pregnancy test was not done.  The risks (including infection, bleeding, pain, and uterine perforation) and benefits of the procedure were explained to the patient and Written informed consent was obtained.  Antibiotic prophylaxis against endocarditis was not indicated.   The patient was placed in the dorsal lithotomy position.  Bimanual exam showed the uterus to be in the neutral position.  A Graves' speculum inserted in the vagina, and the cervix prepped with povidone iodine.  Endocervical curettage with a Kevorkian curette was not performed.   A sharp tenaculum was applied to the anterior lip of the cervix for stabilization.  A sterile uterine sound was used to sound the uterus to a depth of 5.5cm.  A Pipelle endometrial aspirator was used to sample the endometrium.  Sample was sent for pathologic examination.  Condition: Stable  Complications: None  Plan:  The patient was advised to call for any fever or for prolonged or severe pain or bleeding. She was advised to use OTC analgesics as needed for mild to moderate pain. She was advised to avoid vaginal intercourse for 48 hours or until the bleeding has completely stopped.  Attending Physician Documentation: I performed the endometrial biopsy   Impression Diagnoses this Encounter::   ICD-10-CM   1. Postmenopausal bleeding  N95.0 US PELVIS (TRANSABDOMINAL ONLY)    US PELVIS TRANSVAGINAL NON-OB (TV ONLY)    Endometrial biopsy    Established  relevant diagnosis(es):   Plan/Recommendations: No orders of the defined types were placed in this encounter.   Labs or Scans Ordered: Orders Placed This Encounter  Procedures  . Endometrial biopsy  . US PELVIS (TRANSABDOMINAL ONLY)  . US PELVIS TRANSVAGINAL NON-OB (TV ONLY)    Management:: >EMBx today >sonogram 1 week  Follow up Return in about 1 week (around 10/08/2018) for GYN sono, Follow up, with Dr Elonda Husky.        All questions were answered.

## 2018-10-12 ENCOUNTER — Telehealth: Payer: Self-pay | Admitting: Obstetrics & Gynecology

## 2018-10-12 NOTE — Telephone Encounter (Signed)
Tried to reach patient to remind her of her appointment/restrictions, mailbox is full.

## 2018-10-15 ENCOUNTER — Other Ambulatory Visit: Payer: Self-pay

## 2018-10-15 ENCOUNTER — Ambulatory Visit: Payer: Medicare Other

## 2018-10-15 DIAGNOSIS — N950 Postmenopausal bleeding: Secondary | ICD-10-CM

## 2018-10-15 NOTE — Progress Notes (Signed)
PELVIC US TA/TV:atrophic homogeneous anteverted uterus,wnl,EEC 1.6 mm,normal ovaries bilat,ovaries appear mobile,no free fluid,no pain during ultrasound

## 2018-10-16 ENCOUNTER — Ambulatory Visit (INDEPENDENT_AMBULATORY_CARE_PROVIDER_SITE_OTHER): Payer: Medicare Other | Admitting: Obstetrics & Gynecology

## 2018-10-16 ENCOUNTER — Encounter: Payer: Self-pay | Admitting: Obstetrics & Gynecology

## 2018-10-16 DIAGNOSIS — N950 Postmenopausal bleeding: Secondary | ICD-10-CM

## 2018-10-16 NOTE — Progress Notes (Signed)
TELEHEALTH VIRTUAL GYNECOLOGY VISIT ENCOUNTER NOTE  I connected with Alexandria Ellison on 10/16/18 at  9:45 AM EDT by telephone at home and verified that I am speaking with the correct person using two identifiers.   I discussed the limitations, risks, security and privacy concerns of performing an evaluation and management service by telephone and the availability of in person appointments. I also discussed with the patient that there may be a patient responsible charge related to this service. The patient expressed understanding and agreed to proceed.   History:  Alexandria Ellison is a 78 y.o. [id number] female being evaluated today for results related to her PMB Endometrial biopsy and sonogram were performed  . She denies any abnormal vaginal discharge, bleeding, pelvic pain or other concerns.       Past Medical History:  Diagnosis Date  . Chronic kidney disease   . Colon polyps   . H/O seasonal allergies   . Hypercholesteremia   . Hypertension   . Hypothyroidism   . Pneumothorax    Spontaneous,at age 62  . Skin cancer    Past Surgical History:  Procedure Laterality Date  . COLONOSCOPY  07/13/2011   Procedure: COLONOSCOPY;  Surgeon: Rogene Houston, MD;  Location: [facility];  Service: Endoscopy;  Laterality: N/A;  730  . COLONOSCOPY W/ POLYPECTOMY    . TUBAL LIGATION     The following portions of the patient's history were reviewed and updated as appropriate: allergies, current medications, past family history, past medical history, past social history, past surgical history and problem list.   Health Maintenance:  Normal pap and negative HRHPV on .  Normal mammogram on .   Review of Systems:  Pertinent items noted in HPI and remainder of comprehensive ROS otherwise negative.  Physical Exam:  Physical exam deferred due to nature of the encounter  Endometrial biosy: benign endometrium with evidence of a benign polyp, no atypia or hyperplasia noted  Labs and Imaging No results  found for this or any previous visit (from the past 336 hour(s)). US Pelvis Transvaginal Non-ob (tv Only)  Result Date: 10/15/2018 GYNECOLOGIC SONOGRAM Alexandria Ellison is a 78 y.o. Alexandria Ellison she is here for a pelvic sonogram for postmenopausal bleeding. Uterus                      5.2 x 3.5 x 3.4 cm, Total uterine volume 32 cc,atrophic homogeneous anteverted uterus,wnl Endometrium          1.6 mm, symmetrical, wnl Right ovary             1.4 x 1 x 1.3 cm, wnl Left ovary                2.4 x 1.3 x 1.8 cm, wnl No free fluid Technician Comments: PELVIC US TA/TV:atrophic homogeneous anteverted uterus,wnl,EEC 1.6 mm,normal ovaries bilat,ovaries appear mobile,no free fluid,no pain during ultrasound Chaperone: [employer] 10/15/2018 1:17 PM Clinical Impression and recommendations: I have reviewed the sonogram results above. Tiny atrophic uterus 32 cc total volume, with thin endometrium, and no endometrial stripe concerns.  Free Fluid  no    Small anteverted uterus,                                       No pathology noted on current u/s Jonnie Kind 10/15/2018   US Pelvis (transabdominal Only)  Result Date: 10/15/2018 GYNECOLOGIC SONOGRAM ANYLAH RHODA is a 78 y.o. Alexandria Ellison she is here for a pelvic sonogram for postmenopausal bleeding. Uterus                      5.2 x 3.5 x 3.4 cm, Total uterine volume 32 cc,atrophic homogeneous anteverted uterus,wnl Endometrium          1.6 mm, symmetrical, wnl Right ovary             1.4 x 1 x 1.3 cm, wnl Left ovary                2.4 x 1.3 x 1.8 cm, wnl No free fluid Technician Comments: PELVIC US TA/TV:atrophic homogeneous anteverted uterus,wnl,EEC 1.6 mm,normal ovaries bilat,ovaries appear mobile,no free fluid,no pain during ultrasound Chaperone: [employer] 10/15/2018 1:17 PM Clinical Impression and recommendations: I have reviewed the sonogram results above. Tiny atrophic uterus 32 cc total volume,  with thin endometrium, and no endometrial stripe concerns.                                                                       Free Fluid  no    Small anteverted uterus,                                       No pathology noted on current u/s Jonnie Kind 10/15/2018       No orders of the defined types were placed in this encounter.   No orders of the defined types were placed in this encounter.   Assessment and Plan:       ICD-10-CM   1. Postmenopausal bleeding  N95.0    biopsy and sonogram normal, no further follow up is needed         I discussed the assessment and treatment plan with the patient. The patient was provided an opportunity to ask questions and all were answered. The patient agreed with the plan and demonstrated an understanding of the instructions.   The patient was advised to call back or seek an in-person evaluation/go to the ED if the symptoms worsen or if the condition fails to improve as anticipated.  I provided 11 minutes of non-face-to-face time during this encounter.   Florian Buff, [place] for [facility]

## 2019-09-24 DIAGNOSIS — M5136 Other intervertebral disc degeneration, lumbar region: Secondary | ICD-10-CM | POA: Insufficient documentation

## 2019-09-24 DIAGNOSIS — M5134 Other intervertebral disc degeneration, thoracic region: Secondary | ICD-10-CM | POA: Insufficient documentation

## 2019-09-24 DIAGNOSIS — M51369 Other intervertebral disc degeneration, lumbar region without mention of lumbar back pain or lower extremity pain: Secondary | ICD-10-CM | POA: Insufficient documentation

## 2019-09-24 DIAGNOSIS — M40204 Unspecified kyphosis, thoracic region: Secondary | ICD-10-CM | POA: Insufficient documentation

## 2020-04-21 DIAGNOSIS — N390 Urinary tract infection, site not specified: Secondary | ICD-10-CM

## 2020-04-21 HISTORY — DX: Urinary tract infection, site not specified: N39.0

## 2020-05-20 ENCOUNTER — Emergency Department (HOSPITAL_COMMUNITY): Payer: Medicare PPO

## 2020-05-20 ENCOUNTER — Encounter (HOSPITAL_COMMUNITY): Payer: Self-pay

## 2020-05-20 ENCOUNTER — Other Ambulatory Visit: Payer: Self-pay

## 2020-05-20 ENCOUNTER — Emergency Department (HOSPITAL_COMMUNITY)
Admission: EM | Admit: 2020-05-20 | Discharge: 2020-05-20 | Disposition: A | Discharge status: Home / Self Care | Payer: Medicare PPO | Attending: Emergency Medicine | Admitting: Emergency Medicine

## 2020-05-20 DIAGNOSIS — Z87891 Personal history of nicotine dependence: Secondary | ICD-10-CM | POA: Insufficient documentation

## 2020-05-20 DIAGNOSIS — E039 Hypothyroidism, unspecified: Secondary | ICD-10-CM | POA: Diagnosis not present

## 2020-05-20 DIAGNOSIS — Z79899 Other long term (current) drug therapy: Secondary | ICD-10-CM | POA: Insufficient documentation

## 2020-05-20 DIAGNOSIS — Z85828 Personal history of other malignant neoplasm of skin: Secondary | ICD-10-CM | POA: Diagnosis not present

## 2020-05-20 DIAGNOSIS — N183 Chronic kidney disease, stage 3 unspecified: Secondary | ICD-10-CM | POA: Insufficient documentation

## 2020-05-20 DIAGNOSIS — R300 Dysuria: Secondary | ICD-10-CM | POA: Diagnosis present

## 2020-05-20 DIAGNOSIS — I129 Hypertensive chronic kidney disease with stage 1 through stage 4 chronic kidney disease, or unspecified chronic kidney disease: Secondary | ICD-10-CM | POA: Diagnosis not present

## 2020-05-20 DIAGNOSIS — N3001 Acute cystitis with hematuria: Secondary | ICD-10-CM | POA: Insufficient documentation

## 2020-05-20 LAB — URINALYSIS, ROUTINE W REFLEX MICROSCOPIC
Bilirubin Urine: NEGATIVE
Glucose, UA: NEGATIVE mg/dL
Ketones, ur: NEGATIVE mg/dL
Nitrite: NEGATIVE
Protein, ur: 100 mg/dL — AB
RBC / HPF: >50 RBC/hpf — ABNORMAL HIGH (ref 0–5)
Specific Gravity, Urine: 1.009 (ref 1.005–1.030)
WBC, UA: >50 WBC/hpf — ABNORMAL HIGH (ref 0–5)
pH: 8 (ref 5.0–8.0)

## 2020-05-20 LAB — CBC WITH DIFFERENTIAL/PLATELET
Abs Immature Granulocytes: 0.03 10*3/uL (ref 0.00–0.07)
Basophils Absolute: 0.1 10*3/uL (ref 0.0–0.1)
Basophils Relative: 1 %
Eosinophils Absolute: 0.1 10*3/uL (ref 0.0–0.5)
Eosinophils Relative: 1 %
HCT: 39 % (ref 36.0–46.0)
Hemoglobin: 12.7 g/dL (ref 12.0–15.0)
Immature Granulocytes: 0 %
Lymphocytes Relative: 21 %
Lymphs Abs: 2 10*3/uL (ref 0.7–4.0)
MCH: 29.4 pg (ref 26.0–34.0)
MCHC: 32.6 g/dL (ref 30.0–36.0)
MCV: 90.3 fL (ref 80.0–100.0)
Monocytes Absolute: 0.9 10*3/uL (ref 0.1–1.0)
Monocytes Relative: 10 %
Neutro Abs: 6.1 10*3/uL (ref 1.7–7.7)
Neutrophils Relative %: 67 %
Platelets: 227 10*3/uL (ref 150–400)
RBC: 4.32 MIL/uL (ref 3.87–5.11)
RDW: 13.1 % (ref 11.5–15.5)
WBC: 9.2 10*3/uL (ref 4.0–10.5)
nRBC: 0 % (ref 0.0–0.2)

## 2020-05-20 LAB — COMPREHENSIVE METABOLIC PANEL
ALT: 13 U/L (ref 0–44)
AST: 21 U/L (ref 15–41)
Albumin: 3.6 g/dL (ref 3.5–5.0)
Alkaline Phosphatase: 35 U/L — ABNORMAL LOW (ref 38–126)
Anion gap: 6 (ref 5–15)
BUN: 25 mg/dL — ABNORMAL HIGH (ref 8–23)
CO2: 29 mmol/L (ref 22–32)
Calcium: 9.3 mg/dL (ref 8.9–10.3)
Chloride: 101 mmol/L (ref 98–111)
Creatinine, Ser: 1.06 mg/dL — ABNORMAL HIGH (ref 0.44–1.00)
GFR, Estimated: 53 mL/min — ABNORMAL LOW (ref >60)
Glucose, Bld: 89 mg/dL (ref 70–99)
Potassium: 3.6 mmol/L (ref 3.5–5.1)
Sodium: 136 mmol/L (ref 135–145)
Total Bilirubin: 0.3 mg/dL (ref 0.3–1.2)
Total Protein: 6.5 g/dL (ref 6.5–8.1)

## 2020-05-20 LAB — LIPASE, BLOOD: Lipase: 44 U/L (ref 11–51)

## 2020-05-20 MED ORDER — NITROFURANTOIN MONOHYD MACRO 100 MG PO CAPS: 100.0000 mg | ORAL_CAPSULE | Freq: Two times a day (BID) | ORAL | 0 refills | Status: AC

## 2020-05-20 MED ORDER — IOHEXOL 300 MG/ML  SOLN
100.0000 mL | Freq: Once | INTRAMUSCULAR | Status: AC | PRN
  Administered 2020-05-20: 100 mL via INTRAVENOUS

## 2020-05-20 MED ORDER — SODIUM CHLORIDE 0.9 % IV SOLN
1.0000 g | Freq: Once | INTRAVENOUS | Status: AC
  Administered 2020-05-20: 1 g via INTRAVENOUS
  Filled 2020-05-20: qty 10

## 2020-05-20 MED ORDER — NITROFURANTOIN MONOHYD MACRO 100 MG PO CAPS: 100.0000 mg | ORAL_CAPSULE | Freq: Two times a day (BID) | ORAL | 0 refills | Status: DC

## 2020-05-20 NOTE — ED Notes (Signed)
CULTURE TUBE IN LAB

## 2020-05-20 NOTE — Discharge Instructions (Addendum)
You have a UTI and have started you on antibiotics please take as prescribed.  I need you to stay hydrated as this will help flush out the infection.  I want you to follow-up with a urologist for further evaluation of the blood in your urine, given contact information above please call.  I like you to follow-up with your primary care doctor in 1 week's time to repeat your urine to ensure that it is getting better.  Come back to the emergency department if you develop chest pain, shortness of breath, severe abdominal pain, uncontrolled nausea, vomiting, diarrhea.

## 2020-05-20 NOTE — ED Provider Notes (Signed)
[place] EMERGENCY DEPARTMENT Provider Note   CSN: [id number] Arrival date & time: 05/20/20  1312     History No chief complaint on file.   Alexandria Ellison is a 80 y.o. female.  HPI   Patient with significant medical history of CKD stage III, hypertension, hypothyroidism presents with chief complaint dysuria.  She endorses that she was recently diagnosed with a UTI approxi-10 days ago, she was started on Keflex and recently finished her antibiotics.  She endorses starting yesterday she started to develop urinary frequency, burning sensation when she urinates, and hematuria, she also had associated nausea without vomiting, and lower back pain.  Patient denies systemic symptoms like fevers, chills, abdominal pain, flank pain vomiting, diarrhea, constipation.  She states she has never had this happen to her in the past, she has no history of kidney stones, no bleeding disorders.  Patient has no history of abdominal history.  Patient denies alleviating factors.  Patient denies headaches, fevers, chills, shortness of breath, chest pain, abdominal pain, vomiting, diarrhea, worsening pedal edema.  Past Medical History:  Diagnosis Date  . Chronic kidney disease   . Colon polyps   . H/O seasonal allergies   . Hypercholesteremia   . Hypertension   . Hypothyroidism   . Pneumothorax    Spontaneous,at age 80  . Skin cancer     There are no problems to display for this patient.   Past Surgical History:  Procedure Laterality Date  . COLONOSCOPY  07/13/2011   Procedure: COLONOSCOPY;  Surgeon: Rogene Houston, MD;  Location: [facility];  Service: Endoscopy;  Laterality: N/A;  730  . COLONOSCOPY W/ POLYPECTOMY    . TUBAL LIGATION       OB History    Gravida  2   Para  2   Term  2   Preterm      AB      Living  2     SAB      IAB      Ectopic      Multiple      Live Births  2           Family History  Problem Relation Age of Onset  . Stomach  cancer Mother   . Colon cancer Father     Social History   Tobacco Use  . Smoking status: Former Smoker    Packs/day: 0.25    Years: 0.50    Pack years: 0.12  . Smokeless tobacco: Former Network engineer Use Topics  . Alcohol use: No  . Drug use: No    Home Medications Prior to Admission medications   Medication Sig Start Date End Date Taking? Authorizing Provider  B Complex-C (B-COMPLEX WITH VITAMIN C) tablet Take 1 tablet by mouth every morning.    [provider]  Clobetasol Propionate (TEMOVATE) 0.05 % external spray Apply 1 application topically daily as needed. For flares    [provider]  fish oil-omega-3 fatty acids 1000 MG capsule Take 1 g by mouth 2 (two) times daily.    [provider]  levothyroxine (SYNTHROID, LEVOTHROID) 100 MCG tablet Take 100 mcg by mouth daily before breakfast.    [provider]  loratadine (CLARITIN) 10 MG tablet Take 10 mg by mouth daily as needed. For allergies    [provider]  losartan (COZAAR) 100 MG tablet Take 100 mg by mouth daily.    [provider]  Multiple Vitamin (MULITIVITAMIN WITH  MINERALS) TABS Take 1 tablet by mouth daily.    [provider]  naphazoline-pheniramine (NAPHCON-A) 0.025-0.3 % ophthalmic solution Place 1 drop into both eyes 4 (four) times daily as needed. For watery eyes    [provider]  nitrofurantoin, macrocrystal-monohydrate, (MACROBID) 100 MG capsule Take 1 capsule (100 mg total) by mouth 2 (two) times daily for 7 days. 05/20/20 05/27/20  Marcello Fennel, PA-C  omeprazole (PRILOSEC) 40 MG capsule Take 20 mg by mouth daily.    [provider]  PRESCRIPTION MEDICATION Apply 1 application topically daily as needed. Compounded salicylic acid 56% in petroleum  Apply to feet as needed    [provider]  PRESCRIPTION MEDICATION Amlopine Bestylate 2.5 mg at bedtime    [provider]  rosuvastatin (CRESTOR) 5 MG  tablet Take 5 mg by mouth daily.    [provider]  triamterene-hydrochlorothiazide (MAXZIDE-25) 37.5-25 MG per tablet Take 1 tablet by mouth every morning.    [provider]    Allergies    Aspirin and Phenobarbital  Review of Systems   Review of Systems  Constitutional: Negative for chills and fever.  HENT: Negative for congestion.   Respiratory: Negative for shortness of breath.   Cardiovascular: Negative for chest pain.  Gastrointestinal: Positive for nausea. Negative for abdominal pain and vomiting.  Genitourinary: Positive for dysuria, hematuria and urgency. Negative for enuresis, flank pain, vaginal bleeding, vaginal discharge and vaginal pain.  Musculoskeletal: Negative for back pain.  Skin: Negative for rash.  Neurological: Negative for dizziness and headaches.  Hematological: Does not bruise/bleed easily.    Physical Exam Updated Vital Signs BP (!) 154/90 (BP Location: Left Arm)   Pulse 74   Temp 98.1 F (36.7 C) (Oral)   Resp 14   SpO2 97%   Physical Exam Vitals and nursing note reviewed.  Constitutional:      General: She is not in acute distress.    Appearance: She is not ill-appearing.  HENT:     Head: Normocephalic and atraumatic.     Nose: No congestion.  Eyes:     Conjunctiva/sclera: Conjunctivae normal.  Cardiovascular:     Rate and Rhythm: Normal rate and regular rhythm.     Pulses: Normal pulses.     Heart sounds: No murmur heard. No friction rub. No gallop.   Pulmonary:     Effort: No respiratory distress.     Breath sounds: No wheezing, rhonchi or rales.  Abdominal:     Palpations: Abdomen is soft.     Tenderness: There is no abdominal tenderness. There is no right CVA tenderness or left CVA tenderness.  Musculoskeletal:     Right lower leg: No edema.     Left lower leg: No edema.  Skin:    General: Skin is warm and dry.  Neurological:     Mental Status: She is alert.  Psychiatric:        Mood and Affect: Mood  normal.     ED Results / Procedures / Treatments   Labs (all labs ordered are listed, but only abnormal results are displayed) Labs Reviewed  URINALYSIS, ROUTINE W REFLEX MICROSCOPIC - Abnormal; Notable for the following components:      Result Value   APPearance CLOUDY (*)    Hgb urine dipstick LARGE (*)    Protein, ur 100 (*)    Leukocytes,Ua LARGE (*)    RBC / HPF >50 (*)    WBC, UA >50 (*)    Bacteria, UA  MANY (*)    Non Squamous Epithelial 0-5 (*)    All other components within normal limits  COMPREHENSIVE METABOLIC PANEL - Abnormal; Notable for the following components:   BUN 25 (*)    Creatinine, Ser 1.06 (*)    Alkaline Phosphatase 35 (*)    GFR, Estimated 53 (*)    All other components within normal limits  URINE CULTURE  CBC WITH DIFFERENTIAL/PLATELET  LIPASE, BLOOD    EKG EKG Interpretation  Date/Time:  Wednesday May 20 2020 16:07:03 EDT Ventricular Rate:  62 PR Interval:  224 QRS Duration: 114 QT Interval:  452 QTC Calculation: 459 R Axis:   71 Text Interpretation: Sinus rhythm Prolonged PR interval Borderline intraventricular conduction delay Low voltage, precordial leads Borderline T abnormalities, anterior leads Confirmed by Quintella Reichert [phone]) on 05/20/2020 4:15:55 PM   Radiology CT Abdomen Pelvis W Contrast  Result Date: 05/20/2020 CLINICAL DATA:  Hematuria, lower abdominal pain EXAM: CT ABDOMEN AND PELVIS WITH CONTRAST TECHNIQUE: Multidetector CT imaging of the abdomen and pelvis was performed using the standard protocol following bolus administration of intravenous contrast. CONTRAST:  175m OMNIPAQUE IOHEXOL 300 MG/ML  SOLN COMPARISON:  Pelvic ultrasound 10/15/2018 (no report) FINDINGS: Lower chest: Lung bases are clear. Normal heart size. No pericardial effusion. Hepatobiliary: Suspect a small intrahepatic vascular shunt towards the anterior left lobe liver (3/10-12). Focal fatty infiltration seen towards the falciform ligament. No other focal  or concerning liver lesion. Smooth surface contour. Normal gallbladder and biliary tree without visible calcified gallstone. Pancreas: No pancreatic ductal dilatation or surrounding inflammatory changes. Spleen: Normal splenic size. Small capsular calcification along the posterior spleen may reflect sequela of prior injury or infarct. No acute concerning focal splenic lesion. Adrenals/Urinary Tract: Normal adrenal glands. Kidneys enhance and excrete symmetrically. Multiple fluid attenuation cysts are present in both kidneys. Additional scattered tiny subcentimeter hypoattenuating foci in both kidneys too small to fully characterize on CT imaging but statistically likely benign. No obstructive urolithiasis or hydronephrosis. Moderate distention of the urinary bladder with circumferential bladder wall thickening accounting for the bladder volume. Some slight urothelial enhancement noted as well. Stomach/Bowel: Small sliding-type hiatal hernia. Distal stomach and duodenum are unremarkable with normal sweep across the midline abdomen. No small bowel thickening or dilatation. Appendix is not visualized. No focal inflammation the vicinity of the cecum to suggest an occult appendicitis. No colonic dilatation or wall thickening. Scattered colonic diverticula without focal inflammation to suggest diverticulitis. Vascular/Lymphatic: Atherosclerotic calcifications within the abdominal aorta and branch vessels. No aneurysm or ectasia. No enlarged abdominopelvic lymph nodes. Reproductive: Anteverted uterus.  No concerning adnexal lesion. Other: No abdominopelvic free fluid or free gas. No bowel containing hernias. Tiny fat containing left inguinal and umbilical hernias. Musculoskeletal: Multilevel discogenic and facet degenerative changes in the spine. Interspinous arthrosis compatible with Baastrup's disease. Remote anterior wedging compression deformity at T12 with up to 40% height loss anteriorly. Superimposed Schmorl's node  formation. Additional degenerative changes in the hips and pelvis. IMPRESSION: 1. Moderate distention of the urinary bladder with circumferential bladder wall thickening accounting for the bladder volume. Some slight urothelial enhancement noted as well. Correlate with urinalysis to exclude cystitis. 2. No obstructive urolithiasis or hydronephrosis. Bilateral renal cysts. 3. Colonic diverticulosis without evidence of diverticulitis. 4. Small sliding-type hiatal hernia. 5. Remote anterior wedging compression deformity at T12 with up to 40% height loss anteriorly. 6. Aortic Atherosclerosis (ICD10-I70.0). Electronically Signed   By: PLovena LeM.D.   On: 05/20/2020 19:17    Procedures Procedures  Medications Ordered in ED Medications  cefTRIAXone (ROCEPHIN) 1 g in sodium chloride 0.9 % 100 mL IVPB (0 g Intravenous Stopped 05/20/20 1915)  iohexol (OMNIPAQUE) 300 MG/ML solution 100 mL (100 mLs Intravenous Contrast Given 05/20/20 1908)    ED Course  I have reviewed the triage vital signs and the nursing notes.  Pertinent labs & imaging results that were available during my care of the patient were reviewed by me and considered in my medical decision making (see chart for details).    MDM Rules/Calculators/A&P                         Initial impression-patient presents with URI like symptoms.  She is alert, does not appear in acute distress, vital signs reassuring.  Will obtain basic lab work, UA, EKG and reassess.  Work-up-CBC unremarkable, CMP shows elevated BUN 25, creatinine 1.06, alk phos 35.  Lipase 44, UA shows large leukocytes, many red blood cells, many white blood cells, many bacteria.  CT abdomen pelvis shows distention urinary bladder with bladder wall thickening possible cystitis.  Diverticulosis without diverticulitis, small hiatal hernia.   Consult- will consult with pharmacy for further recommendations outpatient therapy for UTI.  Spoke with pharmacist Bertis Ruddy recommends  Macrobid as her culture reveals she has and is sensitive to E. coli as well as Enterococcus.   Reassessment updated patient on UA, concern for worsening UTI, will start her on antibiotics and send her down for CT abdomen pelvis for further evaluation.  Patient is reassessed, continues have no complaints, vital signs remained stable, updated patient on lab and imaging, patient is agreeable for discharge at this time.  Rule out-low suspicion for systemic infection as patient is nontoxic-appearing, vital signs reassuring.  Low suspicion for pyelonephritis or kidney stone as patient has no flank pain or CVA tenderness, patient is nontoxic-appearing, CT imaging negative for stranding around the kidneys or stones within the ureters.  Low suspicion for malignancy as CT abdomen pelvis does not reveal  masses or abnormalities within the bladder to cause hematuria.  Plan-suspect patient suffering from cystitis with hematuria.  Patient's had a culture which was positive for E. Coli and Enterococcus both sensitive to him Macrobid will start her on 7 days course.  Have her follow-up with PCP for repeat UA and urology for further evaluation hematuria.  Vital signs have remained stable, no indication for hospital admission.  Patient discussed with attending and they agreed with assessment and plan.  Patient given at home care as well strict return precautions.  Patient verbalized that they understood agreed to said plan.   Final Clinical Impression(s) / ED Diagnoses Final diagnoses:  Acute cystitis with hematuria    Rx / DC Orders ED Discharge Orders         Ordered    nitrofurantoin, macrocrystal-monohydrate, (MACROBID) 100 MG capsule  2 times daily,   Status:  Discontinued        05/20/20 1943    nitrofurantoin, macrocrystal-monohydrate, (MACROBID) 100 MG capsule  2 times daily        05/20/20 1949           Aron Baba 05/20/20 2108    Quintella Reichert, MD 05/26/20 [phone]

## 2020-05-20 NOTE — ED Triage Notes (Signed)
Patient complains of lower abdominal pressure and dysuria with blood in urine x 2 days.

## 2020-05-21 LAB — URINE CULTURE: Culture: NO GROWTH

## 2020-06-01 DIAGNOSIS — N2581 Secondary hyperparathyroidism of renal origin: Secondary | ICD-10-CM | POA: Insufficient documentation

## 2020-06-15 ENCOUNTER — Ambulatory Visit: Payer: Medicare PPO | Admitting: Family Medicine

## 2020-06-15 ENCOUNTER — Other Ambulatory Visit: Payer: Self-pay

## 2020-06-15 ENCOUNTER — Encounter: Payer: Self-pay | Admitting: Family Medicine

## 2020-06-15 VITALS — BP 128/71 | HR 66 | Temp 98.1°F | Ht 65.0 in | Wt 176.2 lb

## 2020-06-15 DIAGNOSIS — E039 Hypothyroidism, unspecified: Secondary | ICD-10-CM | POA: Diagnosis not present

## 2020-06-15 DIAGNOSIS — I10 Essential (primary) hypertension: Secondary | ICD-10-CM | POA: Diagnosis not present

## 2020-06-15 DIAGNOSIS — N289 Disorder of kidney and ureter, unspecified: Secondary | ICD-10-CM | POA: Diagnosis not present

## 2020-06-15 MED ORDER — PREDNISONE 10 MG PO TABS: ORAL_TABLET | ORAL | 0 refills | Status: DC

## 2020-06-15 NOTE — Progress Notes (Signed)
Subjective:  Patient ID: Alexandria Ellison, female    DOB: 11-19-1940  Age: 80 y.o. MRN: [id number]  CC: Establish Care   HPI Alexandria Ellison presents for stage three renal. Skin Ca. And cholesterol. Also blood count a little low recently. Sees Dr. Marijo Sanes [place] at [facility] in [place]. Needs a doctor here since they are living here much of the time.  Patient in for follow-up of GERD. Currently asymptomatic taking  PPI daily. There is no chest pain or heartburn. No hematemesis and no melena. No dysphagia or choking. Onset is remote. Progression is stable. Complicating factors, none. Sees Dr. Jaynie Crumble for GI at [place]. [phone].  Sees a derm, Dr. Marcell Anger,  for at [facility] and [place]  Neprology is [facility]. Dr. Johann Capers at [phone]  Sees Cardiology, Dr. Einar Grad with [place], [place].     Right back and leg pain.  Chronic.  Interferes with ambulation.  Moderate in severity.   Depression screen [facility] 2/9 06/15/2020  Decreased Interest 0  Down, Depressed, Hopeless 0  PHQ - 2 Score 0    History Alexandria Ellison has a past medical history of Chronic kidney disease, Colon polyps, H/O seasonal allergies, Hypercholesteremia, Hypertension, Hypothyroidism, Pneumothorax, and Skin cancer.   She has a past surgical history that includes Colonoscopy w/ polypectomy; Tubal ligation; and Colonoscopy (07/13/2011).   Her family history includes Cancer in her brother; Colon cancer in her father; Hypertension in her brother and sister; Stomach cancer in her mother.She reports that she has quit smoking. She has a 0.13 pack-year smoking history. She has quit using smokeless tobacco. She reports that she does not drink alcohol and does not use drugs.    ROS Review of Systems  Constitutional: Negative.   HENT: Negative.   Eyes: Negative for visual disturbance.  Respiratory: Negative for shortness of breath.   Cardiovascular:  Negative for chest pain.  Gastrointestinal: Negative for abdominal pain.  Musculoskeletal: Positive for arthralgias and back pain.    Objective:  BP 128/71   Pulse 66   Temp 98.1 F (36.7 C)   Ht 5\' 5"  (1.651 m)   Wt 176 lb 3.2 oz (79.9 kg)   SpO2 97%   BMI 29.32 kg/m   BP Readings from Last 3 Encounters:  06/15/20 128/71  05/20/20 (!) 154/90  10/01/18 130/81    Wt Readings from Last 3 Encounters:  06/15/20 176 lb 3.2 oz (79.9 kg)  10/01/18 174 lb (78.9 kg)  07/13/11 176 lb (79.8 kg)     Physical Exam Constitutional:      General: She is not in acute distress.    Appearance: She is well-developed.  HENT:     Head: Normocephalic and atraumatic.  Eyes:     Conjunctiva/sclera: Conjunctivae normal.     Pupils: Pupils are equal, round, and reactive to light.  Neck:     Thyroid: No thyromegaly.  Cardiovascular:     Rate and Rhythm: Normal rate and regular rhythm.     Heart sounds: Normal heart sounds. No murmur heard.   Pulmonary:     Effort: Pulmonary effort is normal. No respiratory distress.     Breath sounds: Normal breath sounds. No wheezing or rales.  Abdominal:     General: Bowel sounds are normal. There is no distension.     Palpations: Abdomen is soft.     Tenderness: There is no abdominal tenderness.  Musculoskeletal:  General: Normal range of motion.     Cervical back: Normal range of motion and neck supple.  Lymphadenopathy:     Cervical: No cervical adenopathy.  Skin:    General: Skin is warm and dry.  Neurological:     Mental Status: She is alert and oriented to person, place, and time.  Psychiatric:        Behavior: Behavior normal.        Thought Content: Thought content normal.        Judgment: Judgment normal.       Assessment & Plan:   Alexandria Ellison was seen today for establish care.  Diagnoses and all orders for this visit:  Essential hypertension  Hypothyroidism, unspecified type  Renal  insufficiency Comments: Grade  Other orders -     predniSONE (DELTASONE) 10 MG tablet; Take 5 daily for 3 days followed by 4,3,2 and 1 for 3 days each.       I have discontinued Naturi C. Price's triamterene-hydrochlorothiazide, multivitamin with minerals, fish oil-omega-3 fatty acids, B-complex with vitamin C, loratadine, Clobetasol Propionate, and naphazoline-pheniramine. I am also having her start on predniSONE. Additionally, I am having her maintain her levothyroxine, losartan, PRESCRIPTION MEDICATION, rosuvastatin, omeprazole, Cyanocobalamin (B-12 PO), hydrochlorothiazide, cycloSPORINE, Cholecalciferol (VITAMIN D3 PO), and MAGNESIUM PO.  Allergies as of 06/15/2020      Reactions   Aspirin Palpitations   No reaction with low dose 81 mg aspirin   Phenobarbital Palpitations      Medication List       Accurate as of June 15, 2020 11:59 PM. If you have any questions, ask your nurse or doctor.        STOP taking these medications   B-complex with vitamin C tablet Stopped by: Claretta Fraise, MD   Clobetasol Propionate 0.05 % external spray Commonly known as: TEMOVATE Stopped by: Claretta Fraise, MD   fish oil-omega-3 fatty acids 1000 MG capsule Stopped by: Claretta Fraise, MD   loratadine 10 MG tablet Commonly known as: CLARITIN Stopped by: Claretta Fraise, MD   multivitamin with minerals Tabs tablet Stopped by: Claretta Fraise, MD   naphazoline-pheniramine 0.025-0.3 % ophthalmic solution Commonly known as: NAPHCON-A Stopped by: Claretta Fraise, MD   PRESCRIPTION MEDICATION Stopped by: Claretta Fraise, MD   triamterene-hydrochlorothiazide 37.5-25 MG tablet Commonly known as: MAXZIDE-25 Stopped by: Claretta Fraise, MD     TAKE these medications   B-12 PO Take by mouth.   cycloSPORINE 0.05 % ophthalmic emulsion Commonly known as: RESTASIS 1 drop 2 (two) times daily.   hydrochlorothiazide 25 MG tablet Commonly known as: HYDRODIURIL Take 25 mg by mouth daily.    levothyroxine 100 MCG tablet Commonly known as: SYNTHROID Take 100 mcg by mouth daily before breakfast.   losartan 100 MG tablet Commonly known as: COZAAR Take 100 mg by mouth daily.   MAGNESIUM PO Take by mouth.   omeprazole 40 MG capsule Commonly known as: PRILOSEC Take 20 mg by mouth daily.   predniSONE 10 MG tablet Commonly known as: DELTASONE Take 5 daily for 3 days followed by 4,3,2 and 1 for 3 days each. Started by: Claretta Fraise, MD   PRESCRIPTION MEDICATION Amlopine Bestylate 2.5 mg at bedtime   rosuvastatin 5 MG tablet Commonly known as: CRESTOR Take 5 mg by mouth daily.   VITAMIN D3 PO Take by mouth.        Follow-up: Return in about 6 months (around 12/15/2020).  Claretta Fraise, M.D.

## 2020-06-15 NOTE — Patient Instructions (Signed)

## 2020-06-16 ENCOUNTER — Encounter: Payer: Self-pay | Admitting: Family Medicine

## 2020-07-17 ENCOUNTER — Other Ambulatory Visit: Payer: Self-pay | Admitting: Urology

## 2020-07-17 ENCOUNTER — Ambulatory Visit: Payer: Medicare PPO | Admitting: Family Medicine

## 2020-07-17 DIAGNOSIS — M5431 Sciatica, right side: Secondary | ICD-10-CM | POA: Diagnosis not present

## 2020-07-17 DIAGNOSIS — M5432 Sciatica, left side: Secondary | ICD-10-CM | POA: Diagnosis not present

## 2020-07-17 MED ORDER — GABAPENTIN 100 MG PO CAPS: 100.0000 mg | ORAL_CAPSULE | Freq: Every day | ORAL | 0 refills | Status: DC

## 2020-07-17 NOTE — Progress Notes (Signed)
Virtual Visit via Telephone Note  I connected with Alexandria Ellison on 07/17/20 at 1:00 PM by telephone and verified that I am speaking with the correct person using two identifiers. Alexandria Ellison is currently located at home and her husband is currently with her during this visit. The provider, Loman Brooklyn, FNP is located in their office at time of visit.  I discussed the limitations, risks, security and privacy concerns of performing an evaluation and management service by telephone and the availability of in person appointments. I also discussed with the patient that there may be a patient responsible charge related to this service. The patient expressed understanding and agreed to proceed.  Subjective: PCP: Claretta Fraise, MD  Chief Complaint  Patient presents with  . Back Pain   Patient reports low back pain with bilateral sciatica that started the first of March.  States at night she takes Tylenol extra strength and applies blue emu just so she can fall asleep.  States it is still hard to sleep due to the pain.  She did have a round of prednisone last month which was helpful.  She has seen an orthopedic and was told she needed to have back surgery in the past.   ROS: Per HPI  Current Outpatient Medications:  .  Cholecalciferol (VITAMIN D3 PO), Take by mouth., Disp: , Rfl:  .  Cyanocobalamin (B-12 PO), Take by mouth., Disp: , Rfl:  .  cycloSPORINE (RESTASIS) 0.05 % ophthalmic emulsion, 1 drop 2 (two) times daily., Disp: , Rfl:  .  hydrochlorothiazide (HYDRODIURIL) 25 MG tablet, Take 25 mg by mouth daily., Disp: , Rfl:  .  levothyroxine (SYNTHROID, LEVOTHROID) 100 MCG tablet, Take 100 mcg by mouth daily before breakfast., Disp: , Rfl:  .  losartan (COZAAR) 100 MG tablet, Take 100 mg by mouth daily., Disp: , Rfl:  .  MAGNESIUM PO, Take by mouth., Disp: , Rfl:  .  omeprazole (PRILOSEC) 40 MG capsule, Take 20 mg by mouth daily., Disp: , Rfl:  .  predniSONE (DELTASONE) 10 MG tablet,  Take 5 daily for 3 days followed by 4,3,2 and 1 for 3 days each., Disp: 45 tablet, Rfl: 0 .  PRESCRIPTION MEDICATION, Amlopine Bestylate 2.5 mg at bedtime, Disp: , Rfl:  .  rosuvastatin (CRESTOR) 5 MG tablet, Take 5 mg by mouth daily. (Patient not taking: Reported on 06/15/2020), Disp: , Rfl:   Allergies  Allergen Reactions  . Aspirin Palpitations    No reaction with low dose 81 mg aspirin  . Phenobarbital Palpitations   Past Medical History:  Diagnosis Date  . Chronic kidney disease   . Colon polyps   . H/O seasonal allergies   . Hypercholesteremia   . Hypertension   . Hypothyroidism   . Pneumothorax    Spontaneous,at age 72  . Skin cancer     Observations/Objective: A&O  No respiratory distress or wheezing audible over the phone Mood, judgement, and thought processes all WNL   Assessment and Plan: 1. Bilateral sciatica Discussed with patient she cannot keep taking prednisone every month.  Started her on gabapentin 100 mg at bedtime.  Advised her to call her orthopedic to schedule a follow-up to discuss next steps.  She is also to follow-up with her PCP to see if gabapentin is working and if so if it needs to be increased. - gabapentin (NEURONTIN) 100 MG capsule; Take 1 capsule (100 mg total) by mouth at bedtime.  Dispense: 30 capsule; Refill: 0  Follow Up Instructions:  I discussed the assessment and treatment plan with the patient. The patient was provided an opportunity to ask questions and all were answered. The patient agreed with the plan and demonstrated an understanding of the instructions.   The patient was advised to call back or seek an in-person evaluation if the symptoms worsen or if the condition fails to improve as anticipated.  The above assessment and management plan was discussed with the patient. The patient verbalized understanding of and has agreed to the management plan. Patient is aware to call the clinic if symptoms persist or worsen. Patient is aware  when to return to the clinic for a follow-up visit. Patient educated on when it is appropriate to go to the emergency department.   Time call ended: 1:13 PM  I provided 13 minutes of non-face-to-face time during this encounter.  Hendricks Limes, MSN, APRN, FNP-C [facility] 07/17/20

## 2020-07-29 ENCOUNTER — Other Ambulatory Visit: Payer: Self-pay

## 2020-07-29 ENCOUNTER — Ambulatory Visit: Payer: Medicare PPO | Admitting: Family Medicine

## 2020-07-29 ENCOUNTER — Encounter: Payer: Self-pay | Admitting: Family Medicine

## 2020-07-29 VITALS — BP 146/74 | HR 66 | Temp 98.0°F | Ht 65.0 in | Wt 175.6 lb

## 2020-07-29 DIAGNOSIS — M5431 Sciatica, right side: Secondary | ICD-10-CM | POA: Diagnosis not present

## 2020-07-29 DIAGNOSIS — I10 Essential (primary) hypertension: Secondary | ICD-10-CM

## 2020-07-29 DIAGNOSIS — E039 Hypothyroidism, unspecified: Secondary | ICD-10-CM | POA: Diagnosis not present

## 2020-07-29 DIAGNOSIS — Z01818 Encounter for other preprocedural examination: Secondary | ICD-10-CM | POA: Diagnosis not present

## 2020-07-29 DIAGNOSIS — N329 Bladder disorder, unspecified: Secondary | ICD-10-CM

## 2020-07-29 DIAGNOSIS — M5432 Sciatica, left side: Secondary | ICD-10-CM

## 2020-07-29 NOTE — Progress Notes (Signed)
Subjective:  Patient ID: Alexandria Ellison, female    DOB: 27-Aug-1940  Age: 80 y.o. MRN: [id number]  CC: Pre-op Exam   HPI Alexandria Ellison presents for bladder bx. Planned by Dr. Claudia Desanctis at [facility] in [place]. Two areas of concern. Date for this is June 21. Prednisone tx helped a lot. Made her hyper and interfered with sleep. Gabapentin Helping with the sciatica previously evaluated. Some soreness persists. Hx of use in past. Inhibiting libido.Wants to give it some time to see if it wears off since it is relieving pain.   Depression screen [facility] 2/9 07/29/2020 07/29/2020 06/15/2020  Decreased Interest 0 0 0  Down, Depressed, Hopeless 0 0 0  PHQ - 2 Score 0 0 0    History Alexandria Ellison has a past medical history of Chronic kidney disease, Colon polyps, H/O seasonal allergies, Hypercholesteremia, Hypertension, Hypothyroidism, Pneumothorax, and Skin cancer.   She has a past surgical history that includes Colonoscopy w/ polypectomy; Tubal ligation; and Colonoscopy (07/13/2011).   Her family history includes Cancer in her brother; Colon cancer in her father; Hypertension in her brother and sister; Stomach cancer in her mother.She reports that she has quit smoking. She has a 0.13 pack-year smoking history. She has quit using smokeless tobacco. She reports that she does not drink alcohol and does not use drugs.    ROS Review of Systems  Constitutional: Negative.   HENT: Negative.   Eyes: Negative for visual disturbance.  Respiratory: Negative for shortness of breath.   Cardiovascular: Negative for chest pain.  Gastrointestinal: Negative for abdominal pain.  Musculoskeletal: Negative for arthralgias.  Psychiatric/Behavioral: Positive for sleep disturbance (relief with use of two Tylenol at bdtime).   EKG of 3/322 shows borderline IVCD. No ischemic changes. NSR  Objective:  BP (!) 146/74   Pulse 66   Temp 98 F (36.7 C)   Ht 5\' 5"  (1.651 m)   Wt 175 lb 9.6 oz (79.7 kg)   SpO2 98%   BMI 29.22 kg/m   BP  Readings from Last 3 Encounters:  07/29/20 (!) 146/74  06/15/20 128/71  05/20/20 (!) 154/90    Wt Readings from Last 3 Encounters:  07/29/20 175 lb 9.6 oz (79.7 kg)  06/15/20 176 lb 3.2 oz (79.9 kg)  10/01/18 174 lb (78.9 kg)     Physical Exam Constitutional:      General: She is not in acute distress.    Appearance: She is well-developed.  HENT:     Head: Normocephalic and atraumatic.  Eyes:     Conjunctiva/sclera: Conjunctivae normal.     Pupils: Pupils are equal, round, and reactive to light.  Neck:     Thyroid: No thyromegaly.  Cardiovascular:     Rate and Rhythm: Normal rate and regular rhythm.     Heart sounds: Normal heart sounds. No murmur heard.   Pulmonary:     Effort: Pulmonary effort is normal. No respiratory distress.     Breath sounds: Normal breath sounds. No wheezing or rales.  Abdominal:     General: Bowel sounds are normal. There is no distension.     Palpations: Abdomen is soft.     Tenderness: There is no abdominal tenderness.  Musculoskeletal:        General: Normal range of motion.     Cervical back: Normal range of motion and neck supple.  Lymphadenopathy:     Cervical: No cervical adenopathy.  Skin:    General: Skin is warm and dry.  Neurological:  Mental Status: She is alert and oriented to person, place, and time.  Psychiatric:        Behavior: Behavior normal.        Thought Content: Thought content normal.        Judgment: Judgment normal.       Assessment & Plan:   Alexandria Ellison was seen today for pre-op exam.  Diagnoses and all orders for this visit:  Preoperative clearance  Bilateral sciatica  Essential hypertension  Hypothyroidism, unspecified type  Lesion of urinary bladder    On no anticoagulation. Recent EKG with no significant barriers to surgery. Thyroid function nml. Exam unremarkable.  -Low risk for surgical procedure planned.   Avoid NSAIDs   I have discontinued Catherene C. Price's predniSONE. I am also having  her maintain her levothyroxine, losartan, PRESCRIPTION MEDICATION, rosuvastatin, omeprazole, Cyanocobalamin (B-12 PO), hydrochlorothiazide, cycloSPORINE, Cholecalciferol (VITAMIN D3 PO), MAGNESIUM PO, and gabapentin.  Allergies as of 07/29/2020      Reactions   Aspirin Palpitations   No reaction with low dose 81 mg aspirin   Phenobarbital Palpitations      Medication List       Accurate as of July 29, 2020  2:59 PM. If you have any questions, ask your nurse or doctor.        STOP taking these medications   predniSONE 10 MG tablet Commonly known as: DELTASONE Stopped by: Claretta Fraise, MD     TAKE these medications   B-12 PO Take by mouth.   cycloSPORINE 0.05 % ophthalmic emulsion Commonly known as: RESTASIS 1 drop 2 (two) times daily.   gabapentin 100 MG capsule Commonly known as: NEURONTIN Take 1 capsule (100 mg total) by mouth at bedtime.   hydrochlorothiazide 25 MG tablet Commonly known as: HYDRODIURIL Take 25 mg by mouth daily.   levothyroxine 100 MCG tablet Commonly known as: SYNTHROID Take 100 mcg by mouth daily before breakfast.   losartan 100 MG tablet Commonly known as: COZAAR Take 100 mg by mouth daily.   MAGNESIUM PO Take by mouth.   omeprazole 40 MG capsule Commonly known as: PRILOSEC Take 20 mg by mouth daily.   PRESCRIPTION MEDICATION Amlopine Bestylate 2.5 mg at bedtime   rosuvastatin 5 MG tablet Commonly known as: CRESTOR Take 5 mg by mouth daily.   VITAMIN D3 PO Take by mouth.        Follow-up: Return in about 6 weeks (around 09/09/2020).  Claretta Fraise, M.D.

## 2020-08-06 ENCOUNTER — Other Ambulatory Visit: Payer: Self-pay

## 2020-08-06 ENCOUNTER — Encounter (HOSPITAL_BASED_OUTPATIENT_CLINIC_OR_DEPARTMENT_OTHER): Payer: Self-pay | Admitting: Urology

## 2020-08-06 DIAGNOSIS — H04123 Dry eye syndrome of bilateral lacrimal glands: Secondary | ICD-10-CM

## 2020-08-06 DIAGNOSIS — I10 Essential (primary) hypertension: Secondary | ICD-10-CM

## 2020-08-06 DIAGNOSIS — Z972 Presence of dental prosthetic device (complete) (partial): Secondary | ICD-10-CM

## 2020-08-06 DIAGNOSIS — N189 Chronic kidney disease, unspecified: Secondary | ICD-10-CM

## 2020-08-06 HISTORY — DX: Dry eye syndrome of bilateral lacrimal glands: H04.123

## 2020-08-06 HISTORY — DX: Essential (primary) hypertension: I10

## 2020-08-06 HISTORY — DX: Chronic kidney disease, unspecified: N18.9

## 2020-08-06 HISTORY — DX: Presence of dental prosthetic device (complete) (partial): Z97.2

## 2020-08-06 NOTE — Progress Notes (Signed)
Spoke w/ via phone for pre-op interview---pt Lab needs dos----    I stat 8           Lab results------see below COVID test -----patient states asymptomatic no test needed Arrive at -------715 am 08-11-2020 NPO after MN NO Solid Food.  Clear liquids from MN until---615 am then npo Med rec completed Medications to take morning of surgery -----synthroid, omeprazole, rosuvastatin, restasis Diabetic medication -----n/a Patient instructed no nail polish to be worn day of surgery Patient instructed to bring photo id and insurance card day of surgery Patient aware to have Driver (ride ) / caregiver   spouse ron will stay  for 24 hours after surgery  Patient Special Instructions -----none Pre-Op special Istructions -----none Patient verbalized understanding of instructions that were given at this phone interview. Patient denies shortness of breath, chest pain, fever, cough at this phone interview.   Marthann Schiller 05-20-2020 chart/epic Medical clearance note dr Cletus Gash stacks 07-29-2020 chart/epic [facility] cardiology dr Aleen Campi 05-27-2020 [facility] phone [phone] fax [phone] [facility] nephrology (stage 3 a ckd) dr Carson Myrtle trivedi 11-26-2019 [facility] ([facility] phone [phone] fax [phone]

## 2020-08-10 NOTE — H&P (Signed)
CC/HPI: cc: Gross hematuria   06/25/20: 80 year old woman initially treated for UTI with cephalexin who then developed gross hematuria and urinalysis consistent with infection. Urine culture that time however was negative. She had presented to the emergency room June hematuria and CT the abdomen pelvis showed thick wall bladder with areas of enhancement. Patient was a social smoker when she was younger but never significant amount. Her husband does have bladder cancer and she is familiar with cystoscopies. She denies a history of kidney stones or frequent UTIs.   07/16/2020: 80 year old woman here for cystoscopy. No further episodes of hematuria.     ALLERGIES: Seasonal Allergies     MEDICATIONS: Hydrochlorothiazide 25 mg tablet  Omeprazole 20 mg capsule,delayed release  Amlodipine Besylate 2.5 mg tablet  Levothyroxine 75 mcg capsule  Losartan Potassium 100 mg tablet  Magnesium 500 mg capsule  Prednisone 10 mg tablet  Rosuvastatin Calcium 5 mg tablet  Vitamin B12  Vitamin C 500 mg tablet  Vitamin D3     GU PSH: None   NON-GU PSH: Appendectomy Bilateral Tubal Ligation Removal of skin cancer     GU PMH: Gross hematuria, I would like to proceed with cystoscopy and urine cytology based on patient's gross hematuria with negative urine culture growth. It is somewhat confusing no as urinalysis did appear consistent with infection. Patient's CT scan with areas of enhancement in the bladder. She is amenable to scheduling cystoscopy. - 06/25/2020 Renal cyst, Simple renal cyst seen on imaging. No further workup. - 06/25/2020    NON-GU PMH: None   FAMILY HISTORY: Gastric Cancer - Mother prostate - Father, Brother   SOCIAL HISTORY: Marital Status: Married Preferred Language: English; Ethnicity: Not Hispanic Or Latino; Race: White Current Smoking Status: Patient has never smoked.   Tobacco Use Assessment Completed: Used Tobacco in last 30 days? Has never drank.  Drinks 4+ caffeinated drinks  per day.    REVIEW OF SYSTEMS:    GU Review Female:   Patient denies frequent urination, hard to postpone urination, burning /pain with urination, get up at night to urinate, leakage of urine, stream starts and stops, trouble starting your stream, have to strain to urinate, and being pregnant.  Gastrointestinal (Upper):   Patient denies nausea, vomiting, and indigestion/ heartburn.  Gastrointestinal (Lower):   Patient denies diarrhea and constipation.  Constitutional:   Patient denies fever, night sweats, weight loss, and fatigue.  Skin:   Patient denies skin rash/ lesion and itching.  Eyes:   Patient denies blurred vision and double vision.  Ears/ Nose/ Throat:   Patient denies sore throat and sinus problems.  Hematologic/Lymphatic:   Patient denies swollen glands and easy bruising.  Cardiovascular:   Patient denies leg swelling and chest pains.  Respiratory:   Patient denies cough and shortness of breath.  Endocrine:   Patient denies excessive thirst.  Musculoskeletal:   Patient denies back pain and joint pain.  Neurological:   Patient denies headaches and dizziness.  Psychologic:   Patient denies depression and anxiety.   VITAL SIGNS: None   MULTI-SYSTEM PHYSICAL EXAMINATION:    Constitutional: Well-nourished. No physical deformities. Normally developed. Good grooming.  Neck: Neck symmetrical, not swollen. Normal tracheal position.  Respiratory: No labored breathing, no use of accessory muscles.   Skin: No paleness, no jaundice, no cyanosis. No lesion, no ulcer, no rash.  Neurologic / Psychiatric: Oriented to time, oriented to place, oriented to person. No depression, no anxiety, no agitation.  Gastrointestinal: No rigidity  Eyes: Normal conjunctivae. Normal eyelids.  Ears, Nose, Mouth, and Throat: Left ear no scars, no lesions, no masses. Right ear no scars, no lesions, no masses. Nose no scars, no lesions, no masses. Normal hearing. Normal lips.  Musculoskeletal: Normal gait and  station of head and neck.     Complexity of Data:  Records Review:   Previous Patient Records, POC Tool  Urine Test Review:   Urinalysis   PROCEDURES:         Flexible Cystoscopy - 52000  Risks, benefits, and some of the potential complications of the procedure were discussed at length with the patient including infection, bleeding, voiding discomfort, urinary retention, fever, chills, sepsis, and others. All questions were answered. Informed consent was obtained. Sterile technique and intraurethral analgesia were used.  Meatus:  Normal size. Normal location. Normal condition.  Urethra:  No hypermobility. No leakage.  Ureteral Orifices:  Normal location. Normal size. Normal shape. Effluxed clear urine.  Bladder:  No trabeculation. No tumors. Patchy erythema of posterior superior bladder wall. No stones.      The lower urinary tract was carefully examined. The procedure was well-tolerated and without complications. Instructions were given to call the office immediately for bloody urine, difficulty urinating, urinary retention, painful or frequent urination, fever, chills, nausea, vomiting or other illness. The patient stated that she understood these instructions and would comply with them.         Urinalysis - 81003 Dipstick Dipstick Cont'd  Color: Yellow Bilirubin: Neg  Appearance: Clear Ketones: Neg  Specific Gravity: 1.010 Blood: Neg  pH: 6.5 Protein: Trace  Glucose: Neg Urobilinogen: 0.2    Nitrites: Neg    Leukocyte Esterase: Neg    Notes:      ASSESSMENT:      ICD-10 Details  1 GU:   Gross hematuria - R31.0 Undiagnosed New Problem  2   Bladder tumor/neoplasm - D41.4 Undiagnosed New Problem   PLAN:           Document Letter(s):  Created for Patient: Clinical Summary         Notes:   Discussed cystoscopy findings with patient. I would like to proceed with cystoscopy with bladder biopsy and fulguration in the operating room. Risks and benefits of the procedure were  discussed with the patient in detail and she has elected to proceed. She will be scheduled for surgery sometime in the next few weeks. She does understand there is a small chance she will need a Foley catheter following the procedure.

## 2020-08-11 ENCOUNTER — Other Ambulatory Visit: Payer: Self-pay

## 2020-08-11 ENCOUNTER — Encounter (HOSPITAL_BASED_OUTPATIENT_CLINIC_OR_DEPARTMENT_OTHER)
Admission: RE | Disposition: A | Discharge status: Home / Self Care | Payer: Self-pay | Source: Home / Self Care | Attending: Urology

## 2020-08-11 ENCOUNTER — Ambulatory Visit (HOSPITAL_BASED_OUTPATIENT_CLINIC_OR_DEPARTMENT_OTHER): Payer: Medicare PPO | Admitting: Anesthesiology

## 2020-08-11 ENCOUNTER — Encounter (HOSPITAL_BASED_OUTPATIENT_CLINIC_OR_DEPARTMENT_OTHER): Payer: Self-pay | Admitting: Urology

## 2020-08-11 ENCOUNTER — Ambulatory Visit (HOSPITAL_BASED_OUTPATIENT_CLINIC_OR_DEPARTMENT_OTHER)
Admission: RE | Admit: 2020-08-11 | Discharge: 2020-08-11 | Disposition: A | Discharge status: Home / Self Care | Payer: Medicare PPO | Attending: Urology | Admitting: Urology

## 2020-08-11 DIAGNOSIS — Z79899 Other long term (current) drug therapy: Secondary | ICD-10-CM | POA: Diagnosis not present

## 2020-08-11 DIAGNOSIS — Z8042 Family history of malignant neoplasm of prostate: Secondary | ICD-10-CM | POA: Insufficient documentation

## 2020-08-11 DIAGNOSIS — Z7952 Long term (current) use of systemic steroids: Secondary | ICD-10-CM | POA: Insufficient documentation

## 2020-08-11 DIAGNOSIS — Z800 Family history of malignant neoplasm of digestive organs: Secondary | ICD-10-CM | POA: Diagnosis not present

## 2020-08-11 DIAGNOSIS — N329 Bladder disorder, unspecified: Secondary | ICD-10-CM | POA: Insufficient documentation

## 2020-08-11 HISTORY — PX: CYSTOSCOPY WITH BIOPSY: SHX5122

## 2020-08-11 HISTORY — PX: CYSTOSCOPY WITH FULGERATION: SHX6638

## 2020-08-11 HISTORY — DX: Personal history of other diseases of the nervous system and sense organs: Z86.69

## 2020-08-11 HISTORY — DX: Bladder disorder, unspecified: N32.9

## 2020-08-11 LAB — POCT I-STAT, CHEM 8
BUN: 18 mg/dL (ref 8–23)
Calcium, Ion: 1.27 mmol/L (ref 1.15–1.40)
Chloride: 101 mmol/L (ref 98–111)
Creatinine, Ser: 0.8 mg/dL (ref 0.44–1.00)
Glucose, Bld: 95 mg/dL (ref 70–99)
HCT: 39 % (ref 36.0–46.0)
Hemoglobin: 13.3 g/dL (ref 12.0–15.0)
Potassium: 3.2 mmol/L — ABNORMAL LOW (ref 3.5–5.1)
Sodium: 143 mmol/L (ref 135–145)
TCO2: 29 mmol/L (ref 22–32)

## 2020-08-11 SURGERY — CYSTOSCOPY, WITH BIOPSY
Anesthesia: General | Site: Bladder

## 2020-08-11 MED ORDER — LIDOCAINE HCL (CARDIAC) PF 100 MG/5ML IV SOSY
PREFILLED_SYRINGE | INTRAVENOUS | Status: DC | PRN
  Administered 2020-08-11: 80 mg via INTRAVENOUS

## 2020-08-11 MED ORDER — ONDANSETRON HCL 4 MG/2ML IJ SOLN
INTRAMUSCULAR | Status: DC | PRN
  Administered 2020-08-11: 4 mg via INTRAVENOUS

## 2020-08-11 MED ORDER — SODIUM CHLORIDE 0.9 % IV SOLN: INTRAVENOUS | Status: DC

## 2020-08-11 MED ORDER — DEXAMETHASONE SODIUM PHOSPHATE 4 MG/ML IJ SOLN
INTRAMUSCULAR | Status: DC | PRN
  Administered 2020-08-11: 4 mg via INTRAVENOUS

## 2020-08-11 MED ORDER — ACETAMINOPHEN 500 MG PO TABS
1000.0000 mg | ORAL_TABLET | Freq: Once | ORAL | Status: AC
  Administered 2020-08-11: 1000 mg via ORAL

## 2020-08-11 MED ORDER — STERILE WATER FOR IRRIGATION IR SOLN
Status: DC | PRN
  Administered 2020-08-11: 3000 mL

## 2020-08-11 MED ORDER — CEFAZOLIN SODIUM-DEXTROSE 2-4 GM/100ML-% IV SOLN
INTRAVENOUS | Status: AC
  Filled 2020-08-11: qty 100

## 2020-08-11 MED ORDER — ONDANSETRON HCL 4 MG/2ML IJ SOLN: 4.0000 mg | Freq: Once | INTRAMUSCULAR | Status: DC | PRN

## 2020-08-11 MED ORDER — ACETAMINOPHEN 500 MG PO TABS
ORAL_TABLET | ORAL | Status: AC
  Filled 2020-08-11: qty 2

## 2020-08-11 MED ORDER — CEFAZOLIN SODIUM-DEXTROSE 2-4 GM/100ML-% IV SOLN
2.0000 g | INTRAVENOUS | Status: AC
  Administered 2020-08-11: 2 g via INTRAVENOUS

## 2020-08-11 MED ORDER — PROPOFOL 10 MG/ML IV BOLUS
INTRAVENOUS | Status: DC | PRN
  Administered 2020-08-11: 120 mg via INTRAVENOUS

## 2020-08-11 MED ORDER — FENTANYL CITRATE (PF) 100 MCG/2ML IJ SOLN
INTRAMUSCULAR | Status: DC | PRN
  Administered 2020-08-11: 50 ug via INTRAVENOUS

## 2020-08-11 MED ORDER — FENTANYL CITRATE (PF) 100 MCG/2ML IJ SOLN: 25.0000 ug | INTRAMUSCULAR | Status: DC | PRN

## 2020-08-11 MED ORDER — FENTANYL CITRATE (PF) 100 MCG/2ML IJ SOLN
INTRAMUSCULAR | Status: AC
  Filled 2020-08-11: qty 2

## 2020-08-11 SURGICAL SUPPLY — 14 items
BAG DRAIN URO-CYSTO SKYTR STRL (DRAIN) ×3 IMPLANT
BAG DRN UROCATH (DRAIN) ×1
CLOTH BEACON ORANGE TIMEOUT ST (SAFETY) ×3 IMPLANT
ELECT REM PT RETURN 9FT ADLT (ELECTROSURGICAL) ×3
ELECTRODE REM PT RTRN 9FT ADLT (ELECTROSURGICAL) ×1 IMPLANT
GLOVE SURG ENC MOIS LTX SZ6.5 (GLOVE) ×3 IMPLANT
GOWN STRL REUS W/TWL LRG LVL3 (GOWN DISPOSABLE) ×3 IMPLANT
KIT TURNOVER CYSTO (KITS) ×3 IMPLANT
MANIFOLD NEPTUNE II (INSTRUMENTS) ×3 IMPLANT
PACK CYSTO (CUSTOM PROCEDURE TRAY) ×3 IMPLANT
TUBE CONNECTING 12'X1/4 (SUCTIONS) ×1
TUBE CONNECTING 12X1/4 (SUCTIONS) ×2 IMPLANT
TUBING UROLOGY SET (TUBING) ×3 IMPLANT
WATER STERILE IRR 3000ML UROMA (IV SOLUTION) ×3 IMPLANT

## 2020-08-11 NOTE — Transfer of Care (Signed)
Immediate Anesthesia Transfer of Care Note  Patient: Alexandria Ellison  Procedure(s) Performed: CYSTOSCOPY WITH BIOPSY (Bladder) CYSTOSCOPY WITH FULGERATION (Bladder)  Patient Location: PACU  Anesthesia Type:General  Level of Consciousness: sedated  Airway & Oxygen Therapy: Patient Spontanous Breathing and Patient connected to face mask oxygen  Post-op Assessment: Report given to RN and Post -op Vital signs reviewed and stable  Post vital signs: Reviewed and stable  Last Vitals:  Vitals Value Taken Time  BP 134/73 08/11/20 0941  Temp    Pulse 55 08/11/20 0942  Resp 13 08/11/20 0942  SpO2 100 % 08/11/20 0942  Vitals shown include unvalidated device data.  Last Pain:  Vitals:   08/11/20 0745  TempSrc: Oral  PainSc: 0-No pain         Complications: No notable events documented.

## 2020-08-11 NOTE — Anesthesia Postprocedure Evaluation (Signed)
Anesthesia Post Note  Patient: Alexandria Ellison  Procedure(s) Performed: CYSTOSCOPY WITH BIOPSY (Bladder) CYSTOSCOPY WITH FULGERATION (Bladder)     Patient location during evaluation: PACU Anesthesia Type: General Level of consciousness: awake and alert Pain management: pain level controlled Vital Signs Assessment: post-procedure vital signs reviewed and stable Respiratory status: spontaneous breathing, nonlabored ventilation, respiratory function stable and patient connected to nasal cannula oxygen Cardiovascular status: blood pressure returned to baseline and stable Postop Assessment: no apparent nausea or vomiting Anesthetic complications: no   No notable events documented.  Last Vitals:  Vitals:   08/11/20 1000 08/11/20 1015  BP: 140/77 (!) 153/68  Pulse: (!) 51 (!) 50  Resp: 10 13  Temp:    SpO2: 97% 98%    Last Pain:  Vitals:   08/11/20 1000  TempSrc:   PainSc: 0-No pain                 Catalina Gravel

## 2020-08-11 NOTE — Anesthesia Procedure Notes (Signed)
Procedure Name: LMA Insertion Date/Time: 08/11/2020 9:19 AM Performed by: Maryella Shivers, CRNA Pre-anesthesia Checklist: Patient identified, Emergency Drugs available, Suction available and Patient being monitored Patient Re-evaluated:Patient Re-evaluated prior to induction Oxygen Delivery Method: Circle system utilized Preoxygenation: Pre-oxygenation with 100% oxygen Induction Type: IV induction Ventilation: Mask ventilation without difficulty LMA: LMA inserted LMA Size: 4.0 Number of attempts: 1 Airway Equipment and Method: Bite block Placement Confirmation: positive ETCO2 Tube secured with: Tape Dental Injury: Teeth and Oropharynx as per pre-operative assessment

## 2020-08-11 NOTE — Discharge Instructions (Addendum)
Post Bladder Surgery Instructions   General instructions:     Your recent bladder surgery requires very little post hospital care but some definite precautions.  Despite the fact that no skin incisions were used, the area around the bladder incisions are raw and covered with scabs to promote healing and prevent bleeding. Certain precautions are needed to insure that the scabs are not disturbed over the next 2-4 weeks while the healing proceeds.  Because the raw surface inside your bladder and the irritating effects of urine you may expect frequency of urination and/or urgency (a stronger desire to urinate) and perhaps even getting up at night more often. This will usually resolve or improve slowly over the healing period. You may see some blood in your urine over the first 6 weeks. Do not be alarmed, even if the urine was clear for a while. Get off your feet and drink lots of fluids until clearing occurs. If you start to pass clots or don't improve call us.  Catheter: (If you are discharged with a catheter.)  1. Keep your catheter secured to your leg at all times with tape or the supplied strap. 2. You may experience leakage of urine around your catheter- as long as the  catheter continues to drain, this is normal.  If your catheter stops draining  go to the ER. 3. You may also have blood in your urine, even after it has been clear for  several days; you may even pass some small blood clots or other material.  This  is normal as well.  If this happens, sit down and drink plenty of water to help  make urine to flush out your bladder.  If the blood in your urine becomes worse  after doing this, contact our office or return to the ER. 4. You may use the leg bag (small bag) during the day, but use the large bag at  night.  Diet:  You may return to your normal diet immediately. Because of the raw surface of your bladder, alcohol, spicy foods, foods high in acid and drinks with caffeine may  cause irritation or frequency and should be used in moderation. To keep your urine flowing freely and avoid constipation, drink plenty of fluids during the day (8-10 glasses). Tip: Avoid cranberry juice because it is very acidic.  Activity:  Your physical activity doesn't need to be restricted. However, if you are very active, you may see some blood in the urine. We suggest that you reduce your activity under the circumstances until the bleeding has stopped.  Bowels:  It is important to keep your bowels regular during the postoperative period. Straining with bowel movements can cause bleeding. A bowel movement every other day is reasonable. Use a mild laxative if needed, such as milk of magnesia 2-3 tablespoons, or 2 Dulcolax tablets. Call if you continue to have problems. If you had been taking narcotics for pain, before, during or after your surgery, you may be constipated. Take a laxative if necessary.    Medication:  You should resume your pre-surgery medications unless told not to. In addition you may be given an antibiotic to prevent or treat infection. Antibiotics are not always necessary. All medication should be taken as prescribed until the bottles are finished unless you are having an unusual reaction to one of the drugs.  CYSTOSCOPY HOME CARE INSTRUCTIONS  Activity: Rest for the remainder of the day.  Do not drive or operate equipment today.  You may resume normal  activities in one to two days as instructed by your physician.   Meals: Drink plenty of liquids and eat light foods such as gelatin or soup this evening.  You may return to a normal meal plan tomorrow.  Return to Work: You may return to work in one to two days or as instructed by your physician.  Special Instructions / Symptoms: Call your physician if any of these symptoms occur:   -persistent or heavy bleeding  -bleeding which continues after first few urination  -large blood clots that are difficult to  pass  -urine stream diminishes or stops completely  -fever equal to or higher than 101 degrees Farenheit.  -cloudy urine with a strong, foul odor  -severe pain  Females should always wipe from front to back after elimination.  You may feel some burning pain when you urinate.  This should disappear with time.  Applying moist heat to the lower abdomen or a hot tub bath may help relieve the pain. \   Call for an appointment to arrange follow-up.  **May take Tylenol after 2pm, if needed**    Post Anesthesia Home Care Instructions  Activity: Get plenty of rest for the remainder of the day. A responsible individual must stay with you for 24 hours following the procedure.  For the next 24 hours, DO NOT: -Drive a car -Paediatric nurse -Drink alcoholic beverages -Take any medication unless instructed by your physician -Make any legal decisions or sign important papers.  Meals: Start with liquid foods such as gelatin or soup. Progress to regular foods as tolerated. Avoid greasy, spicy, heavy foods. If nausea and/or vomiting occur, drink only clear liquids until the nausea and/or vomiting subsides. Call your physician if vomiting continues.  Special Instructions/Symptoms: Your throat may feel dry or sore from the anesthesia or the breathing tube placed in your throat during surgery. If this causes discomfort, gargle with warm salt water. The discomfort should disappear within 24 hours.  If you had a scopolamine patch placed behind your ear for the management of post- operative nausea and/or vomiting:  1. The medication in the patch is effective for 72 hours, after which it should be removed.  Wrap patch in a tissue and discard in the trash. Wash hands thoroughly with soap and water. 2. You may remove the patch earlier than 72 hours if you experience unpleasant side effects which may include dry mouth, dizziness or visual disturbances. 3. Avoid touching the patch. Wash your hands with soap  and water after contact with the patch.

## 2020-08-11 NOTE — Op Note (Signed)
Operative Note  Preoperative diagnosis:  1.  Abnormal bladder mucosa  Postoperative diagnosis: Abnormal bladder mucosa  Procedure(s): 1.  Cystoscopy, bladder biopsy, fulguration 1 cm  Surgeon: Jacalyn Lefevre, MD  Assistants:  None  Anesthesia:  General  Complications:  None  EBL:  minimal  Specimens: 1. Posterior superior wall bladder biopsy 2. Bladder washing for cytology   Drains/Catheters: 1.  none  Intraoperative findings:   Normal urethra Bilateral orthotopic ureteral orifices 1-2 cm area of patchy erythema posterior superior bladder wall No papillary bladder masses noted  Indication:  Alexandria Ellison is a 80 y.o. female who presented with gross hematuria found to have an area of patchy erythema on posterior bladder wall.   Description of procedure: After risks and benefits of the procedure were discussed with the patient, informed consent was obtained.  The patient was then taken to the operating room placed in the supine position.  Anesthesia was induced and antibiotics were administered.  The patient was repositioned in the dorsolithotomy position.  She was prepped and draped in the usual sterile fashion and timeout was performed.  A 21 French rigid cystoscope was placed in the urethral meatus and advanced in the bladder and direct visualization.  Bilateral orthotopic ureteral orifices were noted.  There was a small 1 to 2 cm area of erythema on the superior posterior bladder wall.  Cold cup bladder biopsy was then taken from this area.  The surrounding erythema was then fulgurated.  Hemostasis adequate with the irrigant turned off.  Reinspection of the bladder revealed no other abnormal bladder mucosa.  The bladder was decompressed and the cystoscope was removed.  The patient was awakened from anesthesia and transferred the PACU in stable condition.  Plan:  Patient to be discharged home today with follow up in the office to review pathology.

## 2020-08-11 NOTE — Interval H&P Note (Signed)
History and Physical Interval Note:  08/11/2020 8:27 AM  Alexandria Ellison  has presented today for surgery, with the diagnosis of BLADDER LESION.  The various methods of treatment have been discussed with the patient and family. After consideration of risks, benefits and other options for treatment, the patient has consented to  Procedure(s) with comments: CYSTOSCOPY WITH BIOPSY (N/A) - [place] (N/A) as a surgical intervention.  The patient's history has been reviewed, patient examined, no change in status, stable for surgery.  I have reviewed the patient's chart and labs.  Questions were answered to the patient's satisfaction.     Mckensi Redinger D Juandiego Kolenovic

## 2020-08-11 NOTE — Anesthesia Preprocedure Evaluation (Addendum)
Anesthesia Evaluation  Patient identified by MRN, date of birth, ID band Patient awake    Reviewed: Allergy & Precautions, NPO status , Patient's Chart, lab work & pertinent test results  Airway Mallampati: II  TM Distance: >3 FB Neck ROM: Full    Dental  (+) Dental Advisory Given, Partial Lower, Partial Upper   Pulmonary former smoker,    Pulmonary exam normal breath sounds clear to auscultation       Cardiovascular hypertension, Pt. on medications Normal cardiovascular exam Rhythm:Regular Rate:Normal     Neuro/Psych  Headaches,    GI/Hepatic Neg liver ROS, GERD  Medicated,  Endo/Other  Hypothyroidism   Renal/GU Renal InsufficiencyRenal disease   BLADDER LESION    Musculoskeletal negative musculoskeletal ROS (+)   Abdominal   Peds  Hematology negative hematology ROS (+)   Anesthesia Other Findings Day of surgery medications reviewed with the patient.  Reproductive/Obstetrics                             Anesthesia Physical Anesthesia Plan  ASA: 3  Anesthesia Plan: General   Post-op Pain Management:    Induction: Intravenous  PONV Risk Score and Plan: 4 or greater and Dexamethasone, Ondansetron and Treatment may vary due to age or medical condition  Airway Management Planned: LMA  Additional Equipment:   Intra-op Plan:   Post-operative Plan: Extubation in OR  Informed Consent: I have reviewed the patients History and Physical, chart, labs and discussed the procedure including the risks, benefits and alternatives for the proposed anesthesia with the patient or authorized representative who has indicated his/her understanding and acceptance.     Dental advisory given  Plan Discussed with: CRNA  Anesthesia Plan Comments:        Anesthesia Quick Evaluation

## 2020-08-12 ENCOUNTER — Encounter (HOSPITAL_BASED_OUTPATIENT_CLINIC_OR_DEPARTMENT_OTHER): Payer: Self-pay | Admitting: Urology

## 2020-08-12 LAB — SURGICAL PATHOLOGY

## 2020-08-12 LAB — CYTOLOGY - NON PAP

## 2020-08-18 ENCOUNTER — Ambulatory Visit: Payer: Medicare PPO | Admitting: Family Medicine

## 2020-08-19 ENCOUNTER — Ambulatory Visit: Payer: Medicare PPO | Admitting: Family Medicine

## 2020-08-19 ENCOUNTER — Other Ambulatory Visit: Payer: Self-pay

## 2020-08-19 ENCOUNTER — Encounter: Payer: Self-pay | Admitting: Family Medicine

## 2020-08-19 DIAGNOSIS — M5431 Sciatica, right side: Secondary | ICD-10-CM

## 2020-08-19 DIAGNOSIS — M5432 Sciatica, left side: Secondary | ICD-10-CM | POA: Diagnosis not present

## 2020-08-19 MED ORDER — GABAPENTIN 100 MG PO CAPS: 100.0000 mg | ORAL_CAPSULE | Freq: Every day | ORAL | 1 refills | Status: DC

## 2020-08-19 MED ORDER — CYCLOBENZAPRINE HCL 10 MG PO TABS: 10.0000 mg | ORAL_TABLET | Freq: Three times a day (TID) | ORAL | 5 refills | Status: DC | PRN

## 2020-08-19 NOTE — Progress Notes (Signed)
Subjective:  Patient ID: Alexandria Ellison, female    DOB: 10/21/1940  Age: 80 y.o. MRN: [id number]  CC: Follow-up (Back pain)   HPI [name] presents for recheck of her back pain and sciatica.  She has been started on gabapentin.  That seems to be working well her pain has gone near completely.  However she will have an occasional grabbing cramping pain at the region of the left shoulder blade.  It goes away fairly quickly.  She does not want to increase the gabapentin because it does not happen often and often or last long enough.  She relates that she had a bladder biopsy last week and it came back negative.  She is recovered completely from that at this point.  Depression screen [facility] 2/9 08/19/2020 07/29/2020 07/29/2020  Decreased Interest 0 0 0  Down, Depressed, Hopeless 0 0 0  PHQ - 2 Score 0 0 0    History [name] has a past medical history of Bilateral dry eyes (08/06/2020), Chronic kidney disease (08/06/2020), Colon polyps, H/O seasonal allergies, History of migraine, Hypercholesteremia, Hypertension (08/06/2020), Hypothyroidism, Lesion of bladder, Pneumothorax, Skin cancer, UTI (urinary tract infection) (04/2020), and Wears partial dentures (08/06/2020).   She has a past surgical history that includes Colonoscopy w/ polypectomy; Tubal ligation (1973); Colonoscopy (07/13/2011); Upper gi endoscopy; colonscopy (2018); Appendectomy (1973); Cystoscopy with biopsy (N/A, 08/11/2020); and Cystoscopy with fulgeration (N/A, 08/11/2020).   Her family history includes Cancer in her brother; Colon cancer in her father; Hypertension in her brother and sister; Stomach cancer in her mother.She reports that she has quit smoking. Her smoking use included cigarettes. She has quit using smokeless tobacco. She reports that she does not drink alcohol and does not use drugs.    ROS Review of Systems  Constitutional: Negative.   HENT: Negative.    Eyes:  Negative for visual disturbance.  Respiratory:  Negative  for shortness of breath.   Cardiovascular:  Negative for chest pain.  Gastrointestinal:  Negative for abdominal pain.  Musculoskeletal:  Positive for arthralgias, back pain and myalgias.   Objective:  BP 136/68   Pulse 66   Temp 97.6 F (36.4 C)   Ht 5\' 5"  (1.651 m)   Wt 176 lb (79.8 kg)   SpO2 98%   BMI 29.29 kg/m   BP Readings from Last 3 Encounters:  08/19/20 136/68  08/11/20 136/78  07/29/20 (!) 146/74    Wt Readings from Last 3 Encounters:  08/19/20 176 lb (79.8 kg)  08/11/20 177 lb 1.6 oz (80.3 kg)  07/29/20 175 lb 9.6 oz (79.7 kg)     Physical Exam Constitutional:      General: She is not in acute distress.    Appearance: She is well-developed.  Cardiovascular:     Rate and Rhythm: Normal rate and regular rhythm.  Pulmonary:     Breath sounds: Normal breath sounds.  Musculoskeletal:        General: Tenderness (left angle of the scapula.  Muscle spasm noted.) present. Normal range of motion.  Skin:    General: Skin is warm and dry.  Neurological:     Mental Status: She is alert and oriented to person, place, and time.      Assessment & Plan:   Leafy was seen today for follow-up.  Diagnoses and all orders for this visit:  Bilateral sciatica -     gabapentin (NEURONTIN) 100 MG capsule; Take 1 capsule (100 mg total) by mouth at bedtime.  Other orders -  cyclobenzaprine (FLEXERIL) 10 MG tablet; Take 1 tablet (10 mg total) by mouth 3 (three) times daily as needed for muscle spasms.      I am having Brunilda Payor start on cyclobenzaprine. I am also having her maintain her levothyroxine, losartan, PRESCRIPTION MEDICATION, rosuvastatin, omeprazole, Cyanocobalamin (B-12 PO), hydrochlorothiazide, cycloSPORINE, Cholecalciferol (VITAMIN D3 PO), MAGNESIUM PO, Emu Oil, Acetaminophen (TYLENOL ARTHRITIS PAIN PO), and gabapentin.  Allergies as of 08/19/2020       Reactions   Aspirin Palpitations   No reaction with low dose 81 mg aspirin   Phenobarbital  Palpitations        Medication List        Accurate as of August 19, 2020  3:42 PM. If you have any questions, ask your nurse or doctor.          B-12 PO Take by mouth.   cyclobenzaprine 10 MG tablet Commonly known as: FLEXERIL Take 1 tablet (10 mg total) by mouth 3 (three) times daily as needed for muscle spasms. Started by: Claretta Fraise, MD   cycloSPORINE 0.05 % ophthalmic emulsion Commonly known as: RESTASIS 1 drop 2 (two) times daily.   Emu Oil Oil by Does not apply route. To legs at hs   gabapentin 100 MG capsule Commonly known as: NEURONTIN Take 1 capsule (100 mg total) by mouth at bedtime.   hydrochlorothiazide 25 MG tablet Commonly known as: HYDRODIURIL Take 25 mg by mouth daily.   levothyroxine 100 MCG tablet Commonly known as: SYNTHROID Take 100 mcg by mouth daily before breakfast.   losartan 100 MG tablet Commonly known as: COZAAR Take 100 mg by mouth daily.   MAGNESIUM PO Take by mouth.   omeprazole 40 MG capsule Commonly known as: PRILOSEC Take 20 mg by mouth daily.   PRESCRIPTION MEDICATION Amlopine Bestylate 2.5 mg at bedtime   rosuvastatin 5 MG tablet Commonly known as: CRESTOR Take 5 mg by mouth daily.   TYLENOL ARTHRITIS PAIN PO Take 500 mg by mouth as needed. Takes 2 of 500 mg at qhs prn   VITAMIN D3 PO Take by mouth.         Follow-up: No follow-ups on file.  Claretta Fraise, M.D.

## 2020-12-23 ENCOUNTER — Other Ambulatory Visit: Payer: Self-pay

## 2020-12-23 ENCOUNTER — Ambulatory Visit: Payer: Medicare PPO | Admitting: Family Medicine

## 2020-12-23 ENCOUNTER — Encounter: Payer: Self-pay | Admitting: Family Medicine

## 2020-12-23 VITALS — BP 151/83 | HR 64 | Temp 98.2°F | Ht 65.0 in | Wt 173.8 lb

## 2020-12-23 DIAGNOSIS — S60947A Unspecified superficial injury of left little finger, initial encounter: Secondary | ICD-10-CM

## 2020-12-23 DIAGNOSIS — T148XXA Other injury of unspecified body region, initial encounter: Secondary | ICD-10-CM

## 2020-12-23 NOTE — Progress Notes (Signed)
Subjective:  Patient ID: Alexandria Ellison, female    DOB: 1940/12/08, 80 y.o.   MRN: [id number]  Patient Care Team: Claretta Fraise, MD as PCP - General (Family Medicine)   Chief Complaint:  swollen finger (Left pinkie finger//x10 days)   HPI: Alexandria Ellison is a 80 y.o. female presenting on 12/23/2020 for swollen finger (Left pinkie finger//x10 days)   Pt presents today with complaints of swelling and discoloration of her left little finger. States this started a few days ago. Denies known injury. States was tender and swollen more yesterday, this has improved today. She has not tried anything for the symptoms. No other associated symptoms.    Relevant past medical, surgical, family, and social history reviewed and updated as indicated.  Allergies and medications reviewed and updated. Data reviewed: Chart in Epic.   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, basal cell   UTI (urinary tract infection) 04/2020   Wears partial dentures 08/06/2020   upper and lower    Past Surgical History:  Procedure Laterality Date   APPENDECTOMY  1973   COLONOSCOPY  07/13/2011   Procedure: COLONOSCOPY;  Surgeon: Rogene Houston, MD;  Location[facility];  Service: Endoscopy;  Laterality: N/A;  730   COLONOSCOPY W/ POLYPECTOMY     colonscopy  2018   no polyps removed   CYSTOSCOPY WITH BIOPSY N/A 08/11/2020   Procedure: CYSTOSCOPY WITH BIOPSY;  Surgeon: Robley Fries, MD;  Location: [facility];  Service: Urology;  Laterality: N/A;  [place] N/A 08/11/2020    Procedure: CYSTOSCOPY WITH FULGERATION;  Surgeon: Robley Fries, MD;  Location: [facility];  Service: Urology;  Laterality: N/A;   TUBAL LIGATION  1973   UPPER GI ENDOSCOPY     2018    Social History   Socioeconomic History   Marital status: Married    Spouse name: Not on file   Number of children: 2   Years of education: Not on file   Highest education level: Some college, no degree  Occupational History   Not on file  Tobacco Use   Smoking status: Former    Types: Cigarettes   Smokeless tobacco: Former   Tobacco comments:    Social smoker for years quit in 1990's  Vaping Use   Vaping Use: Never used  Substance and Sexual Activity   Alcohol use: No   Drug use: No   Sexual activity: Yes    Birth control/protection: Surgical  Other Topics Concern   Not on file  Social History Narrative   Not on file   Social Determinants of Health   Financial Resource Strain: Not on file  Food Insecurity: Not on file  Transportation Needs: Not on file  Physical Activity: Not on file  Stress: Not on file  Social Connections: Not on file  Intimate Partner Violence: Not on file    Outpatient Encounter Medications as of  12/23/2020  Medication Sig   Acetaminophen (TYLENOL ARTHRITIS PAIN PO) Take 500 mg by mouth as needed. Takes 2 of 500 mg at qhs prn   Cholecalciferol (VITAMIN D3 PO) Take by mouth.   Cyanocobalamin (B-12 PO) Take by mouth.   cyclobenzaprine (FLEXERIL) 10 MG tablet Take 1 tablet (10 mg total) by mouth 3 (three) times daily as needed for muscle spasms.   cycloSPORINE (RESTASIS) 0.05 % ophthalmic emulsion 1 drop 2 (two) times daily.   Emu Oil OIL by Does not apply route. To legs at hs   hydrochlorothiazide (HYDRODIURIL) 25 MG tablet Take 25 mg by mouth daily.   levothyroxine (SYNTHROID, LEVOTHROID) 100 MCG tablet Take 100 mcg by mouth daily before breakfast.   losartan (COZAAR) 100 MG tablet Take 100 mg by mouth daily.   MAGNESIUM PO Take by mouth.    omeprazole (PRILOSEC) 40 MG capsule Take 20 mg by mouth daily.   PRESCRIPTION MEDICATION Amlopine Bestylate 2.5 mg at bedtime   rosuvastatin (CRESTOR) 5 MG tablet Take 5 mg by mouth daily.   gabapentin (NEURONTIN) 100 MG capsule Take 1 capsule (100 mg total) by mouth at bedtime. (Patient not taking: Reported on 12/23/2020)   No facility-administered encounter medications on file as of 12/23/2020.    Allergies  Allergen Reactions   Aspirin Palpitations    No reaction with low dose 81 mg aspirin   Phenobarbital Palpitations    Review of Systems  Respiratory:  Negative for cough, chest tightness, shortness of breath and wheezing.   Cardiovascular:  Negative for chest pain, palpitations and leg swelling.  Musculoskeletal:  Positive for joint swelling.  Skin:  Positive for color change.  All other systems reviewed and are negative.      Objective:  BP (!) 151/83   Pulse 64   Temp 98.2 F (36.8 C)   Ht 5\' 5"  (1.651 m)   Wt 173 lb 12.8 oz (78.8 kg)   SpO2 98%   BMI 28.92 kg/m    Wt Readings from Last 3 Encounters:  12/23/20 173 lb 12.8 oz (78.8 kg)  08/19/20 176 lb (79.8 kg)  08/11/20 177 lb 1.6 oz (80.3 kg)    Physical Exam Vitals and nursing note reviewed.  Constitutional:      General: She is not in acute distress.    Appearance: Normal appearance. She is well-developed and well-groomed. She is not ill-appearing, toxic-appearing or diaphoretic.  HENT:     Head: Normocephalic and atraumatic.     Jaw: There is normal jaw occlusion.     Right Ear: Hearing normal.     Left Ear: Hearing normal.     Nose: Nose normal.     Mouth/Throat:     Lips: Pink.     Mouth: Mucous membranes are moist.     Pharynx: Oropharynx is clear. Uvula midline.  Eyes:     General: Lids are normal.     Extraocular Movements: Extraocular movements intact.     Conjunctiva/sclera: Conjunctivae normal.     Pupils: Pupils are equal, round, and reactive to light.  Neck:     Thyroid: No thyroid  mass, thyromegaly or thyroid tenderness.     Vascular: No carotid bruit or JVD.     Trachea: Trachea and phonation normal.  Cardiovascular:     Rate and Rhythm: Normal rate and regular rhythm.     Chest Wall: PMI is not displaced.     Pulses: Normal pulses.     Heart sounds: No murmur heard.  No friction rub. No gallop.  Pulmonary:     Effort: Pulmonary effort is normal. No respiratory distress.     Breath sounds: Normal breath sounds. No wheezing.  Abdominal:     General: There is no abdominal bruit.     Palpations: There is no hepatomegaly or splenomegaly.  Musculoskeletal:        General: Normal range of motion.     Right hand: Normal.     Left hand: Normal.       Arms:     Cervical back: Normal range of motion and neck supple.  Lymphadenopathy:     Cervical: No cervical adenopathy.  Skin:    General: Skin is warm and dry.     Capillary Refill: Capillary refill takes less than 2 seconds.     Coloration: Skin is not cyanotic, jaundiced or pale.     Findings: No rash.  Neurological:     General: No focal deficit present.     Mental Status: She is alert and oriented to person, place, and time.     Sensory: Sensation is intact.     Motor: Motor function is intact.     Coordination: Coordination is intact.     Gait: Gait is intact.     Deep Tendon Reflexes: Reflexes are normal and symmetric.  Psychiatric:        Attention and Perception: Attention and perception normal.        Mood and Affect: Mood and affect normal.        Speech: Speech normal.        Behavior: Behavior normal. Behavior is cooperative.        Thought Content: Thought content normal.        Cognition and Memory: Cognition and memory normal.        Judgment: Judgment normal.    Results for orders placed or performed during the hospital encounter of 08/11/20  I-STAT, chem 8  Result Value Ref Range   Sodium 143 135 - 145 mmol/L   Potassium 3.2 (L) 3.5 - 5.1 mmol/L   Chloride 101 98 - 111 mmol/L   BUN  18 8 - 23 mg/dL   Creatinine, Ser 0.80 0.44 - 1.00 mg/dL   Glucose, Bld 95 70 - 99 mg/dL   Calcium, Ion 1.27 1.15 - 1.40 mmol/L   TCO2 29 22 - 32 mmol/L   Hemoglobin 13.3 12.0 - 15.0 g/dL   HCT 39.0 36.0 - 46.0 %  Cytology - Non PAP;  Result Value Ref Range   CYTOLOGY - NON GYN      CYTOLOGY - NON PAP CASE: [id number] PATIENT: [place] PRICE Non-Gynecological Cytology Report     Clinical History: Bladder lesion Specimen Submitted:  A. BLADDER WASHING   FINAL MICROSCOPIC DIAGNOSIS: - Negative for high grade urothelial carcinoma  SPECIMEN ADEQUACY: Satisfactory for evaluation  GROSS: gross Received is/are 20cc's of slightly cloudy colorless fluid.(DB:db) Prepared: Smears: 0 Concentration Method (ThinPrep): 1 Cell Block: cell block attempted, not obtained Additional Studies: correlate with [id number].     Final Diagnosis performed by Casimer Lanius, MD.   Electronically signed 08/12/2020 Technical and / or Professional components performed at [facility], [place] [address] [place], [place] [address].  Immunohistochemistry Technical component (if applicable) was performed at [facility]. [address] [place], [place],  [address].   IMMUNOHISTOCHEMISTRY DISCLAIMER (if applicable): Some of th ese immunohistochemical stains may have been developed and the performance characteristics determine by [facility].  Some may not have been cleared or approved by the U.S. Food and Drug Administration. The FDA has determined that such clearance or approval is not necessary. This test is used for clinical purposes. It should not be regarded as investigational or for research. This laboratory is certified under the [place] (CLIA-88) as qualified to perform high complexity clinical laboratory testing.  The controls stained appropriately.   Surgical pathology  Result Value Ref Range    SURGICAL PATHOLOGY      SURGICAL PATHOLOGY CASE: [id number] PATIENT: [facility] [place] PRICE Surgical Pathology Report     Clinical History: Bladder Lesion (kc)     FINAL MICROSCOPIC DIAGNOSIS:  A. BLADDER, POSTERIOR WALL, BIOPSY: Benign urothelium and submucosa   GROSS DESCRIPTION:  The specimen is received in formalin and consists of a 0.3 cm piece of tan-pink soft tissue.  The specimen is entirely submitted in 1 cassette. Craig Staggers 08/11/2020)    Final Diagnosis performed by Casimer Lanius, MD.   Electronically signed 08/12/2020 Technical and / or Professional components performed at [facility], [place] [address] [place], [place] [address].  Immunohistochemistry Technical component (if applicable) was performed at [facility]. [address] [place], [place], [place] [address].   IMMUNOHISTOCHEMISTRY DISCLAIMER (if applicable): Some of these immunohistochemical stains may have been developed and the performance characteristics determine by [facility]. Some may not have been cleared or approved by the U.S. Food and Drug Administration. The FDA has determined that such clearance or approval is not necessary. This test is used for clinical purposes. It should not be regarded as investigational or for research. This laboratory is certified under the [place] (CLIA-88) as qualified to perform high complexity clinical laboratory testing.  The controls stained appropriately.        Pertinent labs & imaging results that were available during my care of the patient were reviewed by me and considered in my medical decision making.  Assessment & Plan:  Lynnzie was seen today for swollen finger.  Diagnoses and all orders for this visit:  Hematoma Hematoma to left distal phalanx of palmar fight finger. Symptomatic care discussed in detail. Report any new, worsening, or persistent symptoms.      Continue all other maintenance medications.  Follow up plan: Return if symptoms worsen or fail to improve.   Continue healthy lifestyle choices, including diet (rich in fruits, vegetables, and lean proteins, and low in salt and simple carbohydrates) and exercise (at least 30 minutes of moderate physical activity daily).  Educational handout given for hematoma  The above assessment and management plan was discussed with the patient. The patient verbalized understanding of and has agreed to the management plan. Patient is aware to call the clinic if they develop any new symptoms or if symptoms persist or worsen. Patient is aware when to return to the clinic for a follow-up visit. Patient educated on when it is appropriate to go to the emergency department.   Monia Pouch, FNP-C [facility] [phone]

## 2021-02-16 ENCOUNTER — Telehealth: Payer: Self-pay | Admitting: Family Medicine

## 2021-02-16 NOTE — Telephone Encounter (Signed)
Okay with me 

## 2021-02-17 ENCOUNTER — Ambulatory Visit (INDEPENDENT_AMBULATORY_CARE_PROVIDER_SITE_OTHER): Payer: Medicare PPO

## 2021-02-17 VITALS — BP 149/90 | HR 64 | Ht 65.0 in | Wt 173.0 lb

## 2021-02-17 DIAGNOSIS — Z0000 Encounter for general adult medical examination without abnormal findings: Secondary | ICD-10-CM | POA: Diagnosis not present

## 2021-02-17 NOTE — Patient Instructions (Signed)
[name] , Thank you for taking time to come for your Medicare Wellness Visit. I appreciate your ongoing commitment to your health goals. Please review the following plan we discussed and let me know if I can assist you in the future.   Screening recommendations/referrals: Colonoscopy: Done 07/13/2011 - no repeat required Mammogram: Appointment made for 03/22/21 @ 2:20 - Repeat annually  Bone Density: Done 10/23/2014 - Repeat every 2 years *due Recommended yearly ophthalmology/optometry visit for glaucoma screening and checkup Recommended yearly dental visit for hygiene and checkup  Vaccinations: Influenza vaccine: Due Pneumococcal vaccine: Done 01/19/2018 - ask about Prevnar Tdap vaccine: Done 08/10/2010 - Repeat in 10 years *due Shingles vaccine: Due   Covid-19: Done 03/05/2019, 04/05/2019, & 02/11/2020  Advanced directives: Advance directive discussed with you today. Even though you declined this today, please call our office should you change your mind, and we can give you the proper paperwork for you to fill out.   Conditions/risks identified: Aim for 30 minutes of exercise or brisk walking each day, drink 6-8 glasses of water and eat lots of fruits and vegetables.   Next appointment: Follow up in one year for your annual wellness visit   Preventive Care 65 Years and Older, Female Preventive care refers to lifestyle choices and visits with your health care provider that can promote health and wellness. What does preventive care include? A yearly physical exam. This is also called an annual well check. Dental exams once or twice a year. Routine eye exams. Ask your health care provider how often you should have your eyes checked. Personal lifestyle choices, including: Daily care of your teeth and gums. Regular physical activity. Eating a healthy diet. Avoiding tobacco and drug use. Limiting alcohol use. Practicing safe sex. Taking low-dose aspirin every day. Taking vitamin and  mineral supplements as recommended by your health care provider. What happens during an annual well check? The services and screenings done by your health care provider during your annual well check will depend on your age, overall health, lifestyle risk factors, and family history of disease. Counseling  Your health care provider may ask you questions about your: Alcohol use. Tobacco use. Drug use. Emotional well-being. Home and relationship well-being. Sexual activity. Eating habits. History of falls. Memory and ability to understand (cognition). Work and work Statistician. Reproductive health. Screening  You may have the following tests or measurements: Height, weight, and BMI. Blood pressure. Lipid and cholesterol levels. These may be checked every 5 years, or more frequently if you are over 71 years old. Skin check. Lung cancer screening. You may have this screening every year starting at age 17 if you have a 30-pack-year history of smoking and currently smoke or have quit within the past 15 years. Fecal occult blood test (FOBT) of the stool. You may have this test every year starting at age 60. Flexible sigmoidoscopy or colonoscopy. You may have a sigmoidoscopy every 5 years or a colonoscopy every 10 years starting at age 68. Hepatitis C blood test. Hepatitis B blood test. Sexually transmitted disease (STD) testing. Diabetes screening. This is done by checking your blood sugar (glucose) after you have not eaten for a while (fasting). You may have this done every 1-3 years. Bone density scan. This is done to screen for osteoporosis. You may have this done starting at age 88. Mammogram. This may be done every 1-2 years. Talk to your health care provider about how often you should have regular mammograms. Talk with your health care provider  about your test results, treatment options, and if necessary, the need for more tests. Vaccines  Your health care provider may recommend  certain vaccines, such as: Influenza vaccine. This is recommended every year. Tetanus, diphtheria, and acellular pertussis (Tdap, Td) vaccine. You may need a Td booster every 10 years. Zoster vaccine. You may need this after age 15. Pneumococcal 13-valent conjugate (PCV13) vaccine. One dose is recommended after age 78. Pneumococcal polysaccharide (PPSV23) vaccine. One dose is recommended after age 74. Talk to your health care provider about which screenings and vaccines you need and how often you need them. This information is not intended to replace advice given to you by your health care provider. Make sure you discuss any questions you have with your health care provider. Document Released: 03/06/2015 Document Revised: 10/28/2015 Document Reviewed: 12/09/2014 Elsevier Interactive Patient Education  2017 [place] Prevention in the Home Falls can cause injuries. They can happen to people of all ages. There are many things you can do to make your home safe and to help prevent falls. What can I do on the outside of my home? Regularly fix the edges of walkways and driveways and fix any cracks. Remove anything that might make you trip as you walk through a door, such as a raised step or threshold. Trim any bushes or trees on the path to your home. Use bright outdoor lighting. Clear any walking paths of anything that might make someone trip, such as rocks or tools. Regularly check to see if handrails are loose or broken. Make sure that both sides of any steps have handrails. Any raised decks and porches should have guardrails on the edges. Have any leaves, snow, or ice cleared regularly. Use sand or salt on walking paths during winter. Clean up any spills in your garage right away. This includes oil or grease spills. What can I do in the bathroom? Use night lights. Install grab bars by the toilet and in the tub and shower. Do not use towel bars as grab bars. Use non-skid mats or  decals in the tub or shower. If you need to sit down in the shower, use a plastic, non-slip stool. Keep the floor dry. Clean up any water that spills on the floor as soon as it happens. Remove soap buildup in the tub or shower regularly. Attach bath mats securely with double-sided non-slip rug tape. Do not have throw rugs and other things on the floor that can make you trip. What can I do in the bedroom? Use night lights. Make sure that you have a light by your bed that is easy to reach. Do not use any sheets or blankets that are too big for your bed. They should not hang down onto the floor. Have a firm chair that has side arms. You can use this for support while you get dressed. Do not have throw rugs and other things on the floor that can make you trip. What can I do in the kitchen? Clean up any spills right away. Avoid walking on wet floors. Keep items that you use a lot in easy-to-reach places. If you need to reach something above you, use a strong step stool that has a grab bar. Keep electrical cords out of the way. Do not use floor polish or wax that makes floors slippery. If you must use wax, use non-skid floor wax. Do not have throw rugs and other things on the floor that can make you trip. What can I do  with my stairs? Do not leave any items on the stairs. Make sure that there are handrails on both sides of the stairs and use them. Fix handrails that are broken or loose. Make sure that handrails are as long as the stairways. Check any carpeting to make sure that it is firmly attached to the stairs. Fix any carpet that is loose or worn. Avoid having throw rugs at the top or bottom of the stairs. If you do have throw rugs, attach them to the floor with carpet tape. Make sure that you have a light switch at the top of the stairs and the bottom of the stairs. If you do not have them, ask someone to add them for you. What else can I do to help prevent falls? Wear shoes that: Do not  have high heels. Have rubber bottoms. Are comfortable and fit you well. Are closed at the toe. Do not wear sandals. If you use a stepladder: Make sure that it is fully opened. Do not climb a closed stepladder. Make sure that both sides of the stepladder are locked into place. Ask someone to hold it for you, if possible. Clearly mark and make sure that you can see: Any grab bars or handrails. First and last steps. Where the edge of each step is. Use tools that help you move around (mobility aids) if they are needed. These include: Canes. Walkers. Scooters. Crutches. Turn on the lights when you go into a dark area. Replace any light bulbs as soon as they burn out. Set up your furniture so you have a clear path. Avoid moving your furniture around. If any of your floors are uneven, fix them. If there are any pets around you, be aware of where they are. Review your medicines with your doctor. Some medicines can make you feel dizzy. This can increase your chance of falling. Ask your doctor what other things that you can do to help prevent falls. This information is not intended to replace advice given to you by your health care provider. Make sure you discuss any questions you have with your health care provider. Document Released: 12/04/2008 Document Revised: 07/16/2015 Document Reviewed: 03/14/2014 Elsevier Interactive Patient Education  2017 [employer].

## 2021-02-17 NOTE — Progress Notes (Signed)
Subjective:   HOLLEE FATE is a 80 y.o. female who presents for an Initial Medicare Annual Wellness Visit.  Virtual Visit via Telephone Note  I connected with  MALY LEMARR on 02/17/21 at  2:45 PM EST by telephone and verified that I am speaking with the correct person using two identifiers.  Location: Patient: Home Provider: [facility] Persons participating in the virtual visit: patient/Nurse Health Advisor   I discussed the limitations, risks, security and privacy concerns of performing an evaluation and management service by telephone and the availability of in person appointments. The patient expressed understanding and agreed to proceed.  Interactive audio and video telecommunications were attempted between this nurse and patient, however failed, due to patient having technical difficulties OR patient did not have access to video capability.  We continued and completed visit with audio only.  Some vital signs may be absent or patient reported.   Raneem Mendolia E Tanecia Mccay, LPN   Review of Systems     Cardiac Risk Factors include: advanced age (>43men, >77 women);sedentary lifestyle;Other (see comment);hypertension;dyslipidemia     Objective:    Today's Vitals   02/17/21 1435  BP: (!) 149/90  Pulse: 64  Weight: 173 lb (78.5 kg)  Height: 5\' 5"  (1.651 m)   Body mass index is 28.79 kg/m.  Advanced Directives 02/17/2021 08/11/2020 05/20/2020 05/20/2020 07/13/2011  Does Patient Have a Medical Advance Directive? No No No No Patient does not have advance directive;Patient would like information  Would patient like information on creating a medical advance directive? No - Patient declined Yes (MAU/Ambulatory/Procedural Areas - Information given) No - Patient declined No - Patient declined Advance directive packet given  Pre-existing out of facility DNR order (yellow form or pink MOST form) - - - - No    Current Medications (verified) Outpatient Encounter Medications as of 02/17/2021  Medication  Sig   Acetaminophen (TYLENOL ARTHRITIS PAIN PO) Take 500 mg by mouth as needed. Takes 2 of 500 mg at qhs prn   amLODipine (NORVASC) 2.5 MG tablet Take 2.5 mg by mouth every morning.   Cholecalciferol (VITAMIN D3 PO) Take by mouth.   Cyanocobalamin (B-12 PO) Take by mouth.   cycloSPORINE (RESTASIS) 0.05 % ophthalmic emulsion 1 drop 2 (two) times daily.   fluorouracil (EFUDEX) 5 % cream Apply topically 2 (two) times daily.   hydrochlorothiazide (HYDRODIURIL) 25 MG tablet Take 25 mg by mouth daily.   levothyroxine (SYNTHROID) 75 MCG tablet Take 75 mcg by mouth daily.   losartan (COZAAR) 100 MG tablet Take 1 tablet by mouth daily.   magnesium oxide (MAG-OX) 400 MG tablet Take 400 mg by mouth daily.   omeprazole (PRILOSEC) 20 MG capsule Take 20 mg by mouth daily.   rosuvastatin (CRESTOR) 5 MG tablet Take 5 mg by mouth daily.   vitamin C (ASCORBIC ACID) 500 MG tablet Take 500 mg by mouth daily.   [DISCONTINUED] MAGNESIUM PO Take by mouth.   [DISCONTINUED] PRESCRIPTION MEDICATION Amlopine Bestylate 2.5 mg at bedtime   cyclobenzaprine (FLEXERIL) 10 MG tablet Take 1 tablet (10 mg total) by mouth 3 (three) times daily as needed for muscle spasms. (Patient not taking: Reported on 02/17/2021)   Emu Oil OIL by Does not apply route. To legs at hs (Patient not taking: Reported on 02/17/2021)   famotidine (PEPCID) 20 MG tablet Take 20 mg by mouth at bedtime. (Patient not taking: Reported on 02/17/2021)   gabapentin (NEURONTIN) 100 MG capsule Take 1 capsule (100 mg total) by mouth at bedtime. (  Patient not taking: Reported on 02/17/2021)   omeprazole (PRILOSEC) 40 MG capsule Take 20 mg by mouth daily. (Patient not taking: Reported on 02/17/2021)   [DISCONTINUED] levothyroxine (SYNTHROID, LEVOTHROID) 100 MCG tablet Take 100 mcg by mouth daily before breakfast. (Patient not taking: Reported on 02/17/2021)   [DISCONTINUED] losartan (COZAAR) 100 MG tablet Take 100 mg by mouth daily.   No facility-administered  encounter medications on file as of 02/17/2021.    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, basal cell   UTI (urinary tract infection) 04/2020   Wears partial dentures 08/06/2020   upper and lower   Past Surgical History:  Procedure Laterality Date   APPENDECTOMY  1973   COLONOSCOPY  07/13/2011   Procedure: COLONOSCOPY;  Surgeon: Rogene Houston, MD;  [facility];  Service: Endoscopy;  Laterality: N/A;  730   COLONOSCOPY W/ POLYPECTOMY     colonscopy  2018   no polyps removed   CYSTOSCOPY WITH BIOPSY N/A 08/11/2020   Procedure: CYSTOSCOPY WITH BIOPSY;  Surgeon: Robley Fries, MD;  Location[facility];  Service: Urology;  Laterality: N/A;  Joyce N/A 08/11/2020   Procedure: CYSTOSCOPY WITH FULGERATION;  Surgeon: Robley Fries, MD;  Location: [facility];  Service: Urology;  Laterality: N/A;   TUBAL LIGATION  1973   UPPER GI ENDOSCOPY     2018   Family History  Problem Relation Age of Onset   Stomach cancer Mother    Colon cancer Father    Hypertension Sister    Cancer Brother        PROSTATE   Hypertension Brother    Social History   Socioeconomic History   Marital status: Married    Spouse name: Not on file   Number of children: 2   Years of education: Not on file   Highest education level: Some college, no degree  Occupational History   Occupation: retired  Tobacco Use   Smoking status: Former    Types: Cigarettes   Smokeless tobacco:  Former   Tobacco comments:    Social smoker for years quit in 1990's  Vaping Use   Vaping Use: Never used  Substance and Sexual Activity   Alcohol use: No   Drug use: No   Sexual activity: Yes    Birth control/protection: Surgical  Other Topics Concern   Not on file  Social History Narrative   Lives part time in [place], part time in [place] in [place] Dr Livia Snellen when here, sees Dr [employer] in [place] when living there   Married to [place] Determinants of Health   Financial Resource Strain: Low Risk    Difficulty of Paying Living Expenses: Not hard at all  Food Insecurity: No Food Insecurity   Worried About Charity fundraiser in the Last Year: Never true   [employer]  of Food in the Last Year: Never true  Transportation Needs: No Transportation Needs   Lack of Transportation (Medical): No   Lack of Transportation (Non-Medical): No  Physical Activity: Insufficiently Active   Days of Exercise per Week: 7 days   Minutes of Exercise per Session: 20 min  Stress: No Stress Concern Present   Feeling of Stress : Not at all  Social Connections: Moderately Isolated   Frequency of Communication with Friends and Family: More than three times a week   Frequency of Social Gatherings with Friends and Family: Twice a week   Attends Religious Services: Never   Marine scientist or Organizations: No   Attends Music therapist: Never   Marital Status: Married    Tobacco Counseling Counseling given: Not Answered Tobacco comments: Social smoker for years quit in 1990's   Clinical Intake:  Pre-visit preparation completed: Yes  Pain : No/denies pain     BMI - recorded: 28.79 Nutritional Status: BMI 25 -29 Overweight Nutritional Risks: None Diabetes: No  How often do you need to have someone help you when you read instructions, pamphlets, or other written materials from your doctor or pharmacy?: 1 - Never  Diabetic? no  Interpreter Needed?:  No  Information entered by :: Cliffie Gingras, LPN   Activities of Daily Living In your present state of health, do you have any difficulty performing the following activities: 02/17/2021 08/11/2020  Hearing? N N  Vision? N N  Difficulty concentrating or making decisions? N N  Walking or climbing stairs? N N  Dressing or bathing? N N  Doing errands, shopping? N -  Preparing Food and eating ? N -  Using the Toilet? N -  In the past six months, have you accidently leaked urine? N -  Do you have problems with loss of bowel control? N -  Managing your Medications? N -  Managing your Finances? N -  Housekeeping or managing your Housekeeping? N -  Some recent data might be hidden    Patient Care Team: Claretta Fraise, MD as PCP - General (Family Medicine)  Indicate any recent Medical Services you may have received from other than [facility] providers in the past year (date may be approximate).     Assessment:   This is a routine wellness examination for Iana.  Hearing/Vision screen Hearing Screening - Comments:: Denies hearing difficulties  Vision Screening - Comments:: Wears rx reading glasses prn only - up to date with annual eye exams with [facility] in [place]  Dietary issues and exercise activities discussed: Current Exercise Habits: Home exercise routine, Type of exercise: walking, Time (Minutes): 20, Frequency (Times/Week): 7, Weekly Exercise (Minutes/Week): 140, Intensity: Mild, Exercise limited by: orthopedic condition(s)   Goals Addressed             This Visit's Progress    LIFESTYLE - DECREASE FALLS RISK   On track      Depression Screen PHQ 2/9 Scores 02/17/2021 12/23/2020 08/19/2020 07/29/2020 07/29/2020 06/15/2020  PHQ - 2 Score 0 0 0 0 0 0  PHQ- 9 Score 1 1 - - - -    Fall Risk Fall Risk  02/17/2021 12/23/2020 08/19/2020 07/29/2020 06/15/2020  Falls in the past year? 1 1 1 1 1   Number falls in past yr: 0 0 0 0 0  Injury with Fall? 0 0 0 0 0  Risk for fall due to : Orthopedic  patient - History of fall(s) History of fall(s) History of fall(s)  Follow up Falls prevention discussed - Falls evaluation completed Falls evaluation completed Falls evaluation completed    FALL RISK PREVENTION PERTAINING TO THE HOME:  Any stairs in or around the home? Yes  If so, are there any without handrails? Yes  - steep steps to come in back door - she doesn't use those Home free of loose throw rugs in walkways, pet beds, electrical cords, etc? Yes  Adequate lighting in your home to reduce risk of falls? Yes   ASSISTIVE DEVICES UTILIZED TO PREVENT FALLS:  Life alert? No  Use of a cane, walker or w/c? No  Grab bars in the bathroom? Yes  Shower chair or bench in shower? No  Elevated toilet seat or a handicapped toilet? No   TIMED UP AND GO:  Was the test performed? No . Telephonic visit.  Cognitive Function:     6CIT Screen 02/17/2021  What Year? 0 points  What month? 0 points  What time? 0 points  Count back from 20 0 points  Months in reverse 0 points  Repeat phrase 2 points  Total Score 2    Immunizations Immunization History  Administered Date(s) Administered   Influenza Whole 01/11/2000, 12/10/2017, 03/13/2019   Influenza,inj,Quad PF,6+ Mos 12/04/2018   PFIZER SARS-COV-2 Pediatric Vaccination 5-64yrs 02/11/2020   Pneumococcal Conjugate-13 04/22/2017   Pneumococcal Polysaccharide-23 01/19/2018    TDAP status: Due, Education has been provided regarding the importance of this vaccine. Advised may receive this vaccine at local pharmacy or Health Dept. Aware to provide a copy of the vaccination record if obtained from local pharmacy or Health Dept. Verbalized acceptance and understanding.  Flu Vaccine status: Due, Education has been provided regarding the importance of this vaccine. Advised may receive this vaccine at local pharmacy or Health Dept. Aware to provide a copy of the vaccination record if obtained from local pharmacy or Health Dept. Verbalized  acceptance and understanding.  Pneumococcal vaccine status: Up to date  Covid-19 vaccine status: Completed vaccines  Qualifies for Shingles Vaccine? Yes   Zostavax completed No   Shingrix Completed?: No.    Education has been provided regarding the importance of this vaccine. Patient has been advised to call insurance company to determine out of pocket expense if they have not yet received this vaccine. Advised may also receive vaccine at local pharmacy or Health Dept. Verbalized acceptance and understanding.  Screening Tests Health Maintenance  Topic Date Due   COVID-19 Vaccine (1) 04/26/1941   TETANUS/TDAP  Never done   Zoster Vaccines- Shingrix (1 of 2) Never done   INFLUENZA VACCINE  09/21/2020   Pneumonia Vaccine 44+ Years old  Completed   DEXA SCAN  Completed   HPV VACCINES  Aged Out    Health Maintenance  Health Maintenance Due  Topic Date Due   COVID-19 Vaccine (1) 04/26/1941   TETANUS/TDAP  Never done   Zoster Vaccines- Shingrix (1 of 2) Never done   INFLUENZA VACCINE  09/21/2020    Colorectal cancer screening: No longer required.   Mammogram status: Ordered 02/17/2021. Pt provided with contact info and advised to call to schedule appt.  Made appt for 03/22/21 @ 2:20  Bone Density status: Completed 10/23/2014. Results reflect: Bone density results: OSTEOPENIA. Repeat every 2 years.  Lung Cancer Screening: (Low Dose CT Chest recommended if Age 107-80 years, 30 pack-year currently smoking OR have quit w/in 15years.) does not qualify.   Additional Screening:  Hepatitis C Screening: does not qualify  Vision Screening: Recommended annual ophthalmology exams for  early detection of glaucoma and other disorders of the eye. Is the patient up to date with their annual eye exam?  Yes  Who is the provider or what is the name of the office in which the patient attends annual eye exams? [facility] in [place] If pt is not established with a provider, would they like to be referred to  a provider to establish care? No .   Dental Screening: Recommended annual dental exams for proper oral hygiene  Community Resource Referral / Chronic Care Management: CRR required this visit?  No   CCM required this visit?  No      Plan:     I have personally reviewed and noted the following in the patients chart:   Medical and social history Use of alcohol, tobacco or illicit drugs  Current medications and supplements including opioid prescriptions. Patient is not currently taking opioid prescriptions. Functional ability and status Nutritional status Physical activity Advanced directives List of other physicians Hospitalizations, surgeries, and ER visits in previous 12 months Vitals Screenings to include cognitive, depression, and falls Referrals and appointments  In addition, I have reviewed and discussed with patient certain preventive protocols, quality metrics, and best practice recommendations. A written personalized care plan for preventive services as well as general preventive health recommendations were provided to patient.     Sandrea Hammond, LPN   [id number]   Nurse Notes: None

## 2021-02-19 ENCOUNTER — Other Ambulatory Visit: Payer: Self-pay | Admitting: Family Medicine

## 2021-02-19 DIAGNOSIS — Z1231 Encounter for screening mammogram for malignant neoplasm of breast: Secondary | ICD-10-CM

## 2021-02-23 ENCOUNTER — Ambulatory Visit: Payer: Medicare PPO | Admitting: Family Medicine

## 2021-02-24 ENCOUNTER — Ambulatory Visit: Payer: Medicare PPO | Admitting: Family Medicine

## 2021-02-25 ENCOUNTER — Telehealth: Payer: Self-pay | Admitting: Family Medicine

## 2021-02-25 NOTE — Telephone Encounter (Signed)
Pt is aware and scheduled for follow up with [facility].

## 2021-02-25 NOTE — Telephone Encounter (Signed)
Okay to change? 

## 2021-03-10 ENCOUNTER — Encounter: Payer: Self-pay | Admitting: Family Medicine

## 2021-03-10 ENCOUNTER — Ambulatory Visit: Payer: Medicare PPO | Admitting: Family Medicine

## 2021-03-10 VITALS — BP 133/83 | HR 62 | Temp 98.7°F | Ht 65.0 in | Wt 173.0 lb

## 2021-03-10 DIAGNOSIS — K219 Gastro-esophageal reflux disease without esophagitis: Secondary | ICD-10-CM

## 2021-03-10 DIAGNOSIS — I10 Essential (primary) hypertension: Secondary | ICD-10-CM | POA: Diagnosis not present

## 2021-03-10 DIAGNOSIS — E039 Hypothyroidism, unspecified: Secondary | ICD-10-CM | POA: Diagnosis not present

## 2021-03-10 DIAGNOSIS — N1831 Chronic kidney disease, stage 3a: Secondary | ICD-10-CM | POA: Diagnosis not present

## 2021-03-10 DIAGNOSIS — E782 Mixed hyperlipidemia: Secondary | ICD-10-CM

## 2021-03-10 DIAGNOSIS — E559 Vitamin D deficiency, unspecified: Secondary | ICD-10-CM

## 2021-03-10 DIAGNOSIS — E538 Deficiency of other specified B group vitamins: Secondary | ICD-10-CM

## 2021-03-10 MED ORDER — OMEPRAZOLE 20 MG PO CPDR: 20.0000 mg | DELAYED_RELEASE_CAPSULE | Freq: Every day | ORAL | 2 refills | Status: DC

## 2021-03-10 NOTE — Progress Notes (Signed)
Subjective:  Patient ID: Alexandria Ellison, female    DOB: 24-Jun-1940, 81 y.o.   MRN: [id number]  Patient Care Team: Baruch Gouty, FNP as PCP - General (Family Medicine)   Chief Complaint:  Establish Care   HPI: Alexandria Ellison is a 81 y.o. female presenting on 03/10/2021 for Establish Care   Pt presents today to establish care with new provider. She lives in [place] part of the year and [place] the other part of the year. She does see [facility], nephrology, and cardiology on a regular basis but would like to establish here for when she is in town. She has well controlled hypertension. She is compliant with regimen without associated chest pain, leg swelling, headaches, visual changes, shortness of breath, or weakness. She has CKD which has been stable for the last several years. She is on ARB therapy. Hypothyroidism is well controlled and has not required adjustments in her repletion therapy. She is on Vit D and Vit B 12 repletion therapy. No reported myalgias, significant arthralgias, or cheilitis. On omeprazole for GERD with great control of symptoms, no symptoms concerning for esophagitis.     Relevant past medical, surgical, family, and social history reviewed and updated as indicated.  Allergies and medications reviewed and updated. Data reviewed: Chart in Epic.   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, basal cell   UTI (urinary tract infection) 04/2020   Wears partial dentures 08/06/2020   upper and lower    Past Surgical History:  Procedure Laterality Date   APPENDECTOMY  1973    COLONOSCOPY  07/13/2011   Procedure: COLONOSCOPY;  Surgeon: Rogene Houston, MD;  [facility];  Service: Endoscopy;  Laterality: N/A;  730   COLONOSCOPY W/ POLYPECTOMY     colonscopy  2018   no polyps removed   CYSTOSCOPY WITH BIOPSY N/A 08/11/2020   Procedure: CYSTOSCOPY WITH BIOPSY;  Surgeon: Robley Fries, MD;  Location: [facility];  Service: Urology;  Laterality: N/A;  Victor N/A 08/11/2020   Procedure: CYSTOSCOPY WITH FULGERATION;  Surgeon: Robley Fries, MD;  Location: [facility];  Service: Urology;  Laterality: N/A;   TUBAL LIGATION  1973   UPPER GI ENDOSCOPY     2018    Social History   Socioeconomic History   Marital status: Married    Spouse name: Not on file   Number of children: 2   Years of education: Not on file   Highest education level: Some college, no degree  Occupational History   Occupation: retired  Tobacco Use   Smoking status: Former    Types: Cigarettes   Smokeless tobacco: Former   Tobacco comments:    Social smoker for years quit in 1990's  Vaping Use   Vaping Use: Never used  Substance and Sexual Activity   Alcohol use: No   Drug use: No   Sexual activity:  Yes    Birth control/protection: Surgical  Other Topics Concern   Not on file  Social History Narrative   Lives part time in [place], [place] Dr Livia Snellen when here, sees Dr [employer] in [place] when living there   Married to [place] Determinants of Health   Financial Resource Strain: Low Risk    Difficulty of Paying Living Expenses: Not hard at all  Food Insecurity: No Food Insecurity   Worried About Charity fundraiser in the Last Year: Never true   Arboriculturist in the Last Year: Never true  Transportation Needs: No Transportation Needs   Lack of Transportation (Medical): No   Lack of Transportation (Non-Medical): No  Physical Activity: Insufficiently Active    Days of Exercise per Week: 7 days   Minutes of Exercise per Session: 20 min  Stress: No Stress Concern Present   Feeling of Stress : Not at all  Social Connections: Moderately Isolated   Frequency of Communication with Friends and Family: More than three times a week   Frequency of Social Gatherings with Friends and Family: Twice a week   Attends Religious Services: Never   Marine scientist or Organizations: No   Attends Music therapist: Never   Marital Status: Married  Human resources officer Violence: Not At Risk   Fear of Current or Ex-Partner: No   Emotionally Abused: No   Physically Abused: No   Sexually Abused: No    Outpatient Encounter Medications as of 03/10/2021  Medication Sig   Acetaminophen (TYLENOL ARTHRITIS PAIN PO) Take 500 mg by mouth as needed. Takes 2 of 500 mg at qhs prn   amLODipine (NORVASC) 2.5 MG tablet Take 2.5 mg by mouth every morning.   Cholecalciferol (VITAMIN D3 PO) Take by mouth.   Cyanocobalamin (B-12 PO) Take by mouth.   cyclobenzaprine (FLEXERIL) 10 MG tablet Take 1 tablet (10 mg total) by mouth 3 (three) times daily as needed for muscle spasms.   cycloSPORINE (RESTASIS) 0.05 % ophthalmic emulsion 1 drop 2 (two) times daily.   Emu Oil OIL by Does not apply route. To legs at hs   famotidine (PEPCID) 20 MG tablet Take 20 mg by mouth at bedtime.   fluorouracil (EFUDEX) 5 % cream Apply topically 2 (two) times daily.   gabapentin (NEURONTIN) 100 MG capsule Take 1 capsule (100 mg total) by mouth at bedtime.   hydrochlorothiazide (HYDRODIURIL) 25 MG tablet Take 25 mg by mouth daily.   levothyroxine (SYNTHROID) 75 MCG tablet Take 75 mcg by mouth daily.   losartan (COZAAR) 100 MG tablet Take 1 tablet by mouth daily.   magnesium oxide (MAG-OX) 400 MG tablet Take 400 mg by mouth daily.   rosuvastatin (CRESTOR) 5 MG tablet Take 5 mg by mouth daily.   vitamin C (ASCORBIC ACID) 500 MG tablet Take 500 mg by mouth daily.   [DISCONTINUED] omeprazole  (PRILOSEC) 20 MG capsule Take 20 mg by mouth daily.   [DISCONTINUED] omeprazole (PRILOSEC) 40 MG capsule Take 20 mg by mouth daily.   omeprazole (PRILOSEC) 20 MG capsule Take 1 capsule (20 mg total) by mouth daily.   No facility-administered encounter medications on file as of 03/10/2021.    Allergies  Allergen Reactions   Aspirin Palpitations    No reaction with low dose 81 mg aspirin   Phenobarbital Palpitations    Review of Systems  Constitutional:  Negative for activity change, appetite change,  chills, diaphoresis, fatigue, fever and unexpected weight change.  HENT: Negative.    Eyes: Negative.   Respiratory:  Negative for cough, chest tightness and shortness of breath.   Cardiovascular:  Negative for chest pain, palpitations and leg swelling.  Gastrointestinal:  Negative for abdominal pain, blood in stool, constipation, diarrhea, nausea and vomiting.  Endocrine: Negative.   Genitourinary:  Negative for decreased urine volume, difficulty urinating, dysuria, frequency and urgency.  Musculoskeletal:  Negative for arthralgias and myalgias.  Skin: Negative.   Allergic/Immunologic: Negative.   Neurological:  Negative for dizziness, tremors, seizures, syncope, facial asymmetry, speech difficulty, weakness, light-headedness, numbness and headaches.  Hematological: Negative.   Psychiatric/Behavioral:  Negative for confusion, hallucinations, sleep disturbance and suicidal ideas.   All other systems reviewed and are negative.      Objective:  BP 133/83    Pulse 62    Temp 98.7 F (37.1 C)    Ht 5' 5"  (1.651 m)    Wt 173 lb (78.5 kg)    SpO2 98%    BMI 28.79 kg/m    Wt Readings from Last 3 Encounters:  03/10/21 173 lb (78.5 kg)  02/17/21 173 lb (78.5 kg)  12/23/20 173 lb 12.8 oz (78.8 kg)    Physical Exam Vitals and nursing note reviewed.  Constitutional:      General: She is not in acute distress.    Appearance: Normal appearance. She is well-developed, well-groomed and  overweight. She is not ill-appearing, toxic-appearing or diaphoretic.  HENT:     Head: Normocephalic and atraumatic.     Jaw: There is normal jaw occlusion.     Right Ear: Hearing normal.     Left Ear: Hearing normal.     Nose: Nose normal.     Mouth/Throat:     Lips: Pink.     Mouth: Mucous membranes are moist.     Pharynx: Oropharynx is clear. Uvula midline.  Eyes:     General: Lids are normal.     Extraocular Movements: Extraocular movements intact.     Conjunctiva/sclera: Conjunctivae normal.     Pupils: Pupils are equal, round, and reactive to light.  Neck:     Thyroid: No thyroid mass, thyromegaly or thyroid tenderness.     Vascular: No carotid bruit or JVD.     Trachea: Trachea and phonation normal.  Cardiovascular:     Rate and Rhythm: Normal rate and regular rhythm.     Chest Wall: PMI is not displaced.     Pulses: Normal pulses.     Heart sounds: Normal heart sounds. No murmur heard.   No friction rub. No gallop.  Pulmonary:     Effort: Pulmonary effort is normal. No respiratory distress.     Breath sounds: Normal breath sounds. No wheezing.  Abdominal:     General: Bowel sounds are normal. There is no distension or abdominal bruit.     Palpations: Abdomen is soft. There is no hepatomegaly or splenomegaly.     Tenderness: There is no abdominal tenderness. There is no right CVA tenderness or left CVA tenderness.     Hernia: No hernia is present.  Musculoskeletal:        General: Normal range of motion.     Cervical back: Normal range of motion and neck supple.     Right lower leg: No edema.     Left lower leg: No edema.  Lymphadenopathy:     Cervical: No cervical adenopathy.  Skin:    General: Skin is warm  and dry.     Capillary Refill: Capillary refill takes less than 2 seconds.     Coloration: Skin is not cyanotic, jaundiced or pale.     Findings: No rash.  Neurological:     General: No focal deficit present.     Mental Status: She is alert and oriented to  person, place, and time.     Cranial Nerves: No cranial nerve deficit.     Sensory: Sensation is intact. No sensory deficit.     Motor: Motor function is intact. No weakness.     Coordination: Coordination is intact. Coordination normal.     Gait: Gait is intact. Gait normal.     Deep Tendon Reflexes: Reflexes are normal and symmetric. Reflexes normal.  Psychiatric:        Attention and Perception: Attention and perception normal.        Mood and Affect: Mood and affect normal.        Speech: Speech normal.        Behavior: Behavior normal. Behavior is cooperative.        Thought Content: Thought content normal.        Cognition and Memory: Cognition and memory normal.        Judgment: Judgment normal.    Results for orders placed or performed during the hospital encounter of 08/11/20  I-STAT, chem 8  Result Value Ref Range   Sodium 143 135 - 145 mmol/L   Potassium 3.2 (L) 3.5 - 5.1 mmol/L   Chloride 101 98 - 111 mmol/L   BUN 18 8 - 23 mg/dL   Creatinine, Ser 0.80 0.44 - 1.00 mg/dL   Glucose, Bld 95 70 - 99 mg/dL   Calcium, Ion 1.27 1.15 - 1.40 mmol/L   TCO2 29 22 - 32 mmol/L   Hemoglobin 13.3 12.0 - 15.0 g/dL   HCT 39.0 36.0 - 46.0 %  Cytology - Non PAP;  Result Value Ref Range   CYTOLOGY - NON GYN      CYTOLOGY - NON PAP CASE: [id number] PATIENT: Alexandria Ellison Non-Gynecological Cytology Report     Clinical History: Bladder lesion Specimen Submitted:  A. BLADDER WASHING   FINAL MICROSCOPIC DIAGNOSIS: - Negative for high grade urothelial carcinoma  SPECIMEN ADEQUACY: Satisfactory for evaluation  GROSS: gross Received is/are 20cc's of slightly cloudy colorless fluid.(DB:db) Prepared: Smears: 0 Concentration Method (ThinPrep): 1 Cell Block: cell block attempted, not obtained Additional Studies: correlate with [id number].     Final Diagnosis performed by Casimer Lanius, MD.   Electronically signed 08/12/2020 Technical and / or Professional components  performed at [facility], [place] [address] [place], [place] [address].  Immunohistochemistry Technical component (if applicable) was performed at [facility]. [address], [place], [place], [place] [address].   IMMUNOHISTOCHEMISTRY DISCLAIMER (if applicable): Some of th ese immunohistochemical stains may have been developed and the performance characteristics determine by [facility]. Some may not have been cleared or approved by the U.S. Food and Drug Administration. The FDA has determined that such clearance or approval is not necessary. This test is used for clinical purposes. It should not be regarded as investigational or for research. This laboratory is certified under the [place] (CLIA-88) as qualified to perform high complexity clinical laboratory testing.  The controls stained appropriately.   Surgical pathology  Result Value Ref Range   SURGICAL PATHOLOGY      SURGICAL PATHOLOGY CASE: [id number] PATIENT: Alexandria Ellison Surgical Pathology  Report     Clinical History: Bladder Lesion (kc)     FINAL MICROSCOPIC DIAGNOSIS:  A. BLADDER, POSTERIOR WALL, BIOPSY: Benign urothelium and submucosa   GROSS DESCRIPTION:  The specimen is received in formalin and consists of a 0.3 cm piece of tan-pink soft tissue.  The specimen is entirely submitted in 1 cassette. Craig Staggers 08/11/2020)    Final Diagnosis performed by Casimer Lanius, MD.   Electronically signed 08/12/2020 Technical and / or Professional components performed at [facility], [place] [address], [place], [place] [address].  Immunohistochemistry Technical component (if applicable) was performed at [facility]. [address], [place], [place], [place] [address].   IMMUNOHISTOCHEMISTRY DISCLAIMER (if applicable): Some of these immunohistochemical stains may have been developed and  the performance characteristics determine by [facility]. Some may not have been cleared or approved by the U.S. Food and Drug Administration. The FDA has determined that such clearance or approval is not necessary. This test is used for clinical purposes. It should not be regarded as investigational or for research. This laboratory is certified under the [place] (CLIA-88) as qualified to perform high complexity clinical laboratory testing.  The controls stained appropriately.        Pertinent labs & imaging results that were available during my care of the patient were reviewed by me and considered in my medical decision making.  Assessment & Plan:  Alexandria Ellison was seen today for establish care.  Diagnoses and all orders for this visit:  Stage 3a chronic kidney disease ([place]) Followed by nephrology and cardiology with [facility] on a regular basis. Will check labs today. On ARB therapy.  -     CBC with Differential/Platelet -     CMP14+EGFR -     VITAMIN D 25 Hydroxy (Vit-D Deficiency, Fractures)  Essential (primary) hypertension Well controlled on current regimen, will continue. DASH diet encouraged. Labs pending.  -     CBC with Differential/Platelet -     CMP14+EGFR -     Lipid panel -     Thyroid Panel With TSH -     VITAMIN D 25 Hydroxy (Vit-D Deficiency, Fractures)  Hypothyroidism (acquired) On repletion therapy and doing well. Will check labs today and adjust repletion therapy if warranted.  -     Thyroid Panel With TSH  Mixed hyperlipidemia On statin therapy and tolerating well. Labs pending. Diet and exercise encouraged.  -     CBC with Differential/Platelet -     CMP14+EGFR -     Lipid panel -     Thyroid Panel With TSH  Vitamin D deficiency On repletion therapy. Labs pending, will adjust regimen if warranted.  -     CBC with Differential/Platelet -     VITAMIN D 25 Hydroxy (Vit-D Deficiency,  Fractures)  Vitamin B12 deficiency On oral repletion therapy. Labs pending. Will adjust regimen if warranted.  -     Vitamin B12  Gastroesophageal reflux disease without esophagitis Well controlled. No red flags concerning for esophagitis. Continue current regimen.  -     omeprazole (PRILOSEC) 20 MG capsule; Take 1 capsule (20 mg total) by mouth daily.     Continue all other maintenance medications.  Follow up plan: Return in about 9 months (around 12/08/2021), or if symptoms worsen or fail to improve.   Continue healthy lifestyle choices, including diet (rich in fruits, vegetables, and lean proteins, and low in salt and simple carbohydrates) and exercise (at  least 30 minutes of moderate physical activity daily).  Educational handout given for health maintenance  The above assessment and management plan was discussed with the patient. The patient verbalized understanding of and has agreed to the management plan. Patient is aware to call the clinic if they develop any new symptoms or if symptoms persist or worsen. Patient is aware when to return to the clinic for a follow-up visit. Patient educated on when it is appropriate to go to the emergency department.   Monia Pouch, FNP-C [facility] [phone]

## 2021-03-11 LAB — CBC WITH DIFFERENTIAL/PLATELET
Basophils Absolute: 0.1 10*3/uL (ref 0.0–0.2)
Basos: 1 %
EOS (ABSOLUTE): 0.1 10*3/uL (ref 0.0–0.4)
Eos: 2 %
Hematocrit: 38.4 % (ref 34.0–46.6)
Hemoglobin: 12.4 g/dL (ref 11.1–15.9)
Immature Grans (Abs): 0 10*3/uL (ref 0.0–0.1)
Immature Granulocytes: 0 %
Lymphocytes Absolute: 2.1 10*3/uL (ref 0.7–3.1)
Lymphs: 34 %
MCH: 28.9 pg (ref 26.6–33.0)
MCHC: 32.3 g/dL (ref 31.5–35.7)
MCV: 90 fL (ref 79–97)
Monocytes Absolute: 0.6 10*3/uL (ref 0.1–0.9)
Monocytes: 10 %
Neutrophils Absolute: 3.2 10*3/uL (ref 1.4–7.0)
Neutrophils: 53 %
Platelets: 214 10*3/uL (ref 150–450)
RBC: 4.29 x10E6/uL (ref 3.77–5.28)
RDW: 13.1 % (ref 11.7–15.4)
WBC: 6 10*3/uL (ref 3.4–10.8)

## 2021-03-11 LAB — CMP14+EGFR
ALT: 9 IU/L (ref 0–32)
AST: 18 IU/L (ref 0–40)
Albumin/Globulin Ratio: 1.9 (ref 1.2–2.2)
Albumin: 4.2 g/dL (ref 3.6–4.6)
Alkaline Phosphatase: 46 IU/L (ref 44–121)
BUN/Creatinine Ratio: 24 (ref 12–28)
BUN: 24 mg/dL (ref 8–27)
Bilirubin Total: 0.4 mg/dL (ref 0.0–1.2)
CO2: 26 mmol/L (ref 20–29)
Calcium: 9.1 mg/dL (ref 8.7–10.3)
Chloride: 103 mmol/L (ref 96–106)
Creatinine, Ser: 0.99 mg/dL (ref 0.57–1.00)
Globulin, Total: 2.2 g/dL (ref 1.5–4.5)
Glucose: 92 mg/dL (ref 70–99)
Potassium: 4 mmol/L (ref 3.5–5.2)
Sodium: 142 mmol/L (ref 134–144)
Total Protein: 6.4 g/dL (ref 6.0–8.5)
eGFR: 57 mL/min/{1.73_m2} — ABNORMAL LOW (ref >59)

## 2021-03-11 LAB — THYROID PANEL WITH TSH
Free Thyroxine Index: 2.4 (ref 1.2–4.9)
T3 Uptake Ratio: 30 % (ref 24–39)
T4, Total: 7.9 ug/dL (ref 4.5–12.0)
TSH: 7.5 u[IU]/mL — ABNORMAL HIGH (ref 0.450–4.500)

## 2021-03-11 LAB — LIPID PANEL
Chol/HDL Ratio: 3 ratio (ref 0.0–4.4)
Cholesterol, Total: 167 mg/dL (ref 100–199)
HDL: 56 mg/dL (ref >39)
LDL Chol Calc (NIH): 91 mg/dL (ref 0–99)
Triglycerides: 112 mg/dL (ref 0–149)
VLDL Cholesterol Cal: 20 mg/dL (ref 5–40)

## 2021-03-11 LAB — VITAMIN B12: Vitamin B-12: 706 pg/mL (ref 232–1245)

## 2021-03-11 LAB — VITAMIN D 25 HYDROXY (VIT D DEFICIENCY, FRACTURES): Vit D, 25-Hydroxy: 33.4 ng/mL (ref 30.0–100.0)

## 2021-03-22 ENCOUNTER — Ambulatory Visit
Admission: RE | Admit: 2021-03-22 | Discharge: 2021-03-22 | Disposition: A | Discharge status: Home / Self Care | Payer: Medicare PPO | Source: Ambulatory Visit | Attending: Family Medicine | Admitting: Family Medicine

## 2021-03-22 DIAGNOSIS — Z1231 Encounter for screening mammogram for malignant neoplasm of breast: Secondary | ICD-10-CM

## 2021-09-17 ENCOUNTER — Encounter: Payer: Self-pay | Admitting: Family Medicine

## 2021-09-17 ENCOUNTER — Ambulatory Visit: Payer: Medicare PPO | Admitting: Family Medicine

## 2021-09-17 VITALS — BP 123/75 | HR 74 | Temp 97.3°F | Ht 65.0 in | Wt 169.0 lb

## 2021-09-17 DIAGNOSIS — E782 Mixed hyperlipidemia: Secondary | ICD-10-CM

## 2021-09-17 DIAGNOSIS — I10 Essential (primary) hypertension: Secondary | ICD-10-CM

## 2021-09-17 DIAGNOSIS — R053 Chronic cough: Secondary | ICD-10-CM | POA: Diagnosis not present

## 2021-09-17 DIAGNOSIS — E538 Deficiency of other specified B group vitamins: Secondary | ICD-10-CM

## 2021-09-17 DIAGNOSIS — E559 Vitamin D deficiency, unspecified: Secondary | ICD-10-CM

## 2021-09-17 DIAGNOSIS — R062 Wheezing: Secondary | ICD-10-CM

## 2021-09-17 DIAGNOSIS — E039 Hypothyroidism, unspecified: Secondary | ICD-10-CM | POA: Diagnosis not present

## 2021-09-17 MED ORDER — MONTELUKAST SODIUM 10 MG PO TABS: 10.0000 mg | ORAL_TABLET | Freq: Every day | ORAL | 3 refills | Status: DC

## 2021-09-17 NOTE — Progress Notes (Signed)
Subjective:  Patient ID: Alexandria Ellison, female    DOB: August 30, 1940, 81 y.o.   MRN: [id number]  Patient Care Team: Baruch Gouty, FNP as PCP - General (Family Medicine)   Chief Complaint:  Cough, chest congestion (Believes she hears wheezing and rattles), and Medical Management of Chronic Issues   HPI: Alexandria Ellison is a 81 y.o. female presenting on 09/17/2021 for Cough, chest congestion (Believes she hears wheezing and rattles), and Medical Management of Chronic Issues   1. Wheezing 2. Chronic cough Pt reports ongoing cough with wheezing and postnasal drainage which seems to be worse at night. She has been taking over the counter Claritin without relief of symptoms. Feels this is worse with the heat and changes in the weather. No fever, chills, shortness of breath, sputum production, weakness, or confusion.   3. Hypothyroidism (acquired) Currently on repletion therapy. Does report an ongoing cough. No hyper- or hypothyroid symptoms reported.   4. Mixed hyperlipidemia On Crestor and tolerating well. No myalgias. Does try to follow a healthy diet. Does not exercise on a regular basis.   5. Essential (primary) hypertension Currently on losartan and amlodipine. Reports great control at home. Recently seen nephrology and states everything was good. She does have a cough but I feel this is more from postnasal drainage than ARB therapy.   6. Vitamin D deficiency On repletion therapy. No arthralgias or trouble walking.   7. Vitamin B12 deficiency On repletion therapy daily.      Relevant past medical, surgical, family, and social history reviewed and updated as indicated.  Allergies and medications reviewed and updated. Data reviewed: Chart in Epic.   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, basal cell   UTI (urinary tract infection) 04/2020   Wears partial dentures 08/06/2020   upper and lower    Past Surgical History:  Procedure Laterality Date   APPENDECTOMY  1973   COLONOSCOPY  07/13/2011   Procedure: COLONOSCOPY;  Surgeon: Rogene Houston, MD;  [facility];  Service: Endoscopy;  Laterality: N/A;  730   COLONOSCOPY W/ POLYPECTOMY     colonscopy  2018   no polyps removed   CYSTOSCOPY WITH BIOPSY N/A 08/11/2020   Procedure: CYSTOSCOPY WITH BIOPSY;  Surgeon: Robley Fries, MD;  Location: [facility];  Service: Urology;  Laterality: N/A;  [place] N/A 08/11/2020   Procedure: CYSTOSCOPY WITH FULGERATION;  Surgeon: Robley Fries, MD;  Location: [facility];  Service: Urology;  Laterality: N/A;   TUBAL LIGATION  1973   UPPER GI ENDOSCOPY     2018    Social History   Socioeconomic History   Marital status: Married    Spouse name: Not on file   Number of children: 2   Years of education: Not on file   Highest education level: Some college, no degree  Occupational History   Occupation: retired  Tobacco Use  Smoking status: Former    Types: Cigarettes   Smokeless tobacco: Former   Tobacco comments:    Social smoker for years quit in 1990's  Vaping Use   Vaping Use: Never used  Substance and Sexual Activity   Alcohol use: No   Drug use: No   Sexual activity: Yes    Birth control/protection: Surgical  Other Topics Concern   Not on file  Social History Narrative   Lives part time in [place], [place] Dr Livia Snellen when here, sees Dr [employer] in [place] when living there   Married to [place] Determinants of Health   Financial Resource Strain:  Low Risk  (02/17/2021)   Overall Financial Resource Strain (CARDIA)    Difficulty of Paying Living Expenses: Not hard at all  Food Insecurity: No Food Insecurity (02/17/2021)   Hunger Vital Sign    Worried About Running Out of Food in the Last Year: Never true    [place] in the Last Year: Never true  Transportation Needs: No Transportation Needs (02/17/2021)   PRAPARE - Hydrologist (Medical): No    Lack of Transportation (Non-Medical): No  Physical Activity: Insufficiently Active (02/17/2021)   Exercise Vital Sign    Days of Exercise per Week: 7 days    Minutes of Exercise per Session: 20 min  Stress: No Stress Concern Present (02/17/2021)   [place]    Feeling of Stress : Not at all  Social Connections: Moderately Isolated (02/17/2021)   Social Connection and Isolation Panel [NHANES]    Frequency of Communication with Friends and Family: More than three times a week    Frequency of Social Gatherings with Friends and Family: Twice a week    Attends Religious Services: Never    Marine scientist or Organizations: No    Attends Archivist Meetings: Never    Marital Status: Married  Human resources officer Violence: Not At Risk (02/17/2021)   Humiliation, Afraid, Rape, and Kick questionnaire    Fear of Current or Ex-Partner: No    Emotionally Abused: No    Physically Abused: No    Sexually Abused: No    Outpatient Encounter Medications as of 09/17/2021  Medication Sig   Acetaminophen (TYLENOL ARTHRITIS PAIN PO) Take 500 mg by mouth as needed. Takes 2 of 500 mg at qhs prn   amLODipine (NORVASC) 2.5 MG tablet Take 2.5 mg by mouth every morning.   Cholecalciferol (VITAMIN D3 PO) Take by mouth.   Cyanocobalamin (B-12 PO) Take by mouth.   cyclobenzaprine (FLEXERIL) 10 MG tablet Take 1 tablet (10 mg total) by mouth 3 (three) times daily as needed for muscle spasms.    cycloSPORINE (RESTASIS) 0.05 % ophthalmic emulsion 1 drop 2 (two) times daily.   Emu Oil OIL by Does not apply route. To legs at hs   famotidine (PEPCID) 20 MG tablet Take 20 mg by mouth at bedtime.   fluorouracil (EFUDEX) 5 % cream Apply topically 2 (two) times daily.   gabapentin (NEURONTIN) 100 MG capsule Take 1 capsule (100 mg total) by mouth at bedtime.   hydrochlorothiazide (HYDRODIURIL) 25 MG tablet Take 25 mg by mouth daily.   levothyroxine (SYNTHROID) 75 MCG tablet Take 75 mcg by mouth daily.   losartan (COZAAR) 100 MG tablet Take 1 tablet by mouth daily.   magnesium oxide (MAG-OX) 400 MG tablet Take  400 mg by mouth daily.   montelukast (SINGULAIR) 10 MG tablet Take 1 tablet (10 mg total) by mouth at bedtime.   omeprazole (PRILOSEC) 20 MG capsule Take 1 capsule (20 mg total) by mouth daily.   rosuvastatin (CRESTOR) 5 MG tablet Take 5 mg by mouth daily.   vitamin C (ASCORBIC ACID) 500 MG tablet Take 500 mg by mouth daily.   No facility-administered encounter medications on file as of 09/17/2021.    Allergies  Allergen Reactions   Aspirin Palpitations    No reaction with low dose 81 mg aspirin   Phenobarbital Palpitations    Review of Systems  Constitutional:  Negative for activity change, appetite change, chills, diaphoresis, fatigue, fever and unexpected weight change.  HENT:  Positive for postnasal drip and rhinorrhea.   Eyes: Negative.  Negative for photophobia and visual disturbance.  Respiratory:  Positive for cough and wheezing. Negative for apnea, choking, chest tightness, shortness of breath and stridor.   Cardiovascular:  Negative for chest pain, palpitations and leg swelling.  Gastrointestinal:  Negative for abdominal pain, blood in stool, constipation, diarrhea, nausea and vomiting.  Endocrine: Negative.   Genitourinary:  Negative for decreased urine volume, difficulty urinating, dysuria, frequency and urgency.  Musculoskeletal:  Negative for arthralgias and  myalgias.  Skin: Negative.   Allergic/Immunologic: Negative.   Neurological:  Negative for dizziness, tremors, seizures, syncope, facial asymmetry, speech difficulty, weakness, light-headedness, numbness and headaches.  Hematological: Negative.   Psychiatric/Behavioral:  Negative for confusion, hallucinations, sleep disturbance and suicidal ideas.   All other systems reviewed and are negative.       Objective:  BP 123/75   Pulse 74   Temp (!) 97.3 F (36.3 C)   Ht 5' 5" (1.651 m)   Wt 169 lb (76.7 kg)   SpO2 96%   BMI 28.12 kg/m    Wt Readings from Last 3 Encounters:  09/17/21 169 lb (76.7 kg)  03/10/21 173 lb (78.5 kg)  02/17/21 173 lb (78.5 kg)    Physical Exam Vitals and nursing note reviewed.  Constitutional:      General: She is not in acute distress.    Appearance: Normal appearance. She is overweight. She is not ill-appearing, toxic-appearing or diaphoretic.  HENT:     Head: Normocephalic and atraumatic.     Right Ear: Ear canal and external ear normal. A middle ear effusion is present. Tympanic membrane is not erythematous.     Left Ear: Ear canal and external ear normal. A middle ear effusion is present. Tympanic membrane is not erythematous.     Nose: Rhinorrhea present.     Comments: Postnasal drainage    Mouth/Throat:     Lips: Pink.     Mouth: Mucous membranes are moist.     Pharynx: No oropharyngeal exudate or posterior oropharyngeal erythema.  Eyes:     Conjunctiva/sclera: Conjunctivae normal.     Pupils: Pupils are equal, round, and reactive to light.  Cardiovascular:     Rate and Rhythm: Normal rate and regular rhythm.     Heart sounds: Normal heart sounds. No murmur heard.    No friction rub. No gallop.  Musculoskeletal:     Cervical back: Normal range of motion.     Right lower leg: No edema.     Left lower leg: No edema.  Skin:    General: Skin is warm and dry.     Capillary Refill: Capillary refill takes less than 2 seconds.  Neurological:  General: No focal deficit present.     Mental Status: She is alert and oriented to person, place, and time.  Psychiatric:        Mood and Affect: Mood normal.        Behavior: Behavior normal.        Thought Content: Thought content normal.        Judgment: Judgment normal.     Results for orders placed or performed in visit on 03/10/21  CBC with Differential/Platelet  Result Value Ref Range   WBC 6.0 3.4 - 10.8 x10E3/uL   RBC 4.29 3.77 - 5.28 x10E6/uL   Hemoglobin 12.4 11.1 - 15.9 g/dL   Hematocrit 38.4 34.0 - 46.6 %   MCV 90 79 - 97 fL   MCH 28.9 26.6 - 33.0 pg   MCHC 32.3 31.5 - 35.7 g/dL   RDW 13.1 11.7 - 15.4 %   Platelets 214 150 - 450 x10E3/uL   Neutrophils 53 Not Estab. %   Lymphs 34 Not Estab. %   Monocytes 10 Not Estab. %   Eos 2 Not Estab. %   Basos 1 Not Estab. %   Neutrophils Absolute 3.2 1.4 - 7.0 x10E3/uL   Lymphocytes Absolute 2.1 0.7 - 3.1 x10E3/uL   Monocytes Absolute 0.6 0.1 - 0.9 x10E3/uL   EOS (ABSOLUTE) 0.1 0.0 - 0.4 x10E3/uL   Basophils Absolute 0.1 0.0 - 0.2 x10E3/uL   Immature Granulocytes 0 Not Estab. %   Immature Grans (Abs) 0.0 0.0 - 0.1 x10E3/uL  CMP14+EGFR  Result Value Ref Range   Glucose 92 70 - 99 mg/dL   BUN 24 8 - 27 mg/dL   Creatinine, Ser 0.99 0.57 - 1.00 mg/dL   eGFR 57 (L) >59 mL/min/1.73   BUN/Creatinine Ratio 24 12 - 28   Sodium 142 134 - 144 mmol/L   Potassium 4.0 3.5 - 5.2 mmol/L   Chloride 103 96 - 106 mmol/L   CO2 26 20 - 29 mmol/L   Calcium 9.1 8.7 - 10.3 mg/dL   Total Protein 6.4 6.0 - 8.5 g/dL   Albumin 4.2 3.6 - 4.6 g/dL   Globulin, Total 2.2 1.5 - 4.5 g/dL   Albumin/Globulin Ratio 1.9 1.2 - 2.2   Bilirubin Total 0.4 0.0 - 1.2 mg/dL   Alkaline Phosphatase 46 44 - 121 IU/L   AST 18 0 - 40 IU/L   ALT 9 0 - 32 IU/L  Lipid panel  Result Value Ref Range   Cholesterol, Total 167 100 - 199 mg/dL   Triglycerides 112 0 - 149 mg/dL   HDL 56 >39 mg/dL   VLDL Cholesterol Cal 20 5 - 40 mg/dL   LDL Chol Calc (NIH) 91  0 - 99 mg/dL   Chol/HDL Ratio 3.0 0.0 - 4.4 ratio  Thyroid Panel With TSH  Result Value Ref Range   TSH 7.500 (H) 0.450 - 4.500 uIU/mL   T4, Total 7.9 4.5 - 12.0 ug/dL   T3 Uptake Ratio 30 24 - 39 %   Free Thyroxine Index 2.4 1.2 - 4.9  VITAMIN D 25 Hydroxy (Vit-D Deficiency, Fractures)  Result Value Ref Range   Vit D, 25-Hydroxy 33.4 30.0 - 100.0 ng/mL  Vitamin B12  Result Value Ref Range   Vitamin B-12 706 232 - 1,245 pg/mL       Pertinent labs & imaging results that were available during my care of the patient were reviewed by me and considered in my medical decision making.  Assessment & Plan:  [place] was seen today for cough, chest congestion and medical management of chronic issues.  Diagnoses and all orders for this visit:  Wheezing Chronic cough Ongoing and worse at night. Concerning for chronic rhinitis or possible undiagnosed COPD as pt has a prior smoking history. No not feel this is ARB related although could be. Will refer to pulmonology and pace on Singulair in the meantime. If the Singulair is not beneficial, will consider stopping losartan and increasing amlodipine dosing.  -     montelukast (SINGULAIR) 10 MG tablet; Take 1 tablet (10 mg total) by mouth at bedtime. -     Ambulatory referral to Pulmonology  Hypothyroidism (acquired) Thyroid disease has been fairly controlled. Labs are pending. Adjustments to regimen will be made if warranted. Make sure to take medications on an empty stomach with a full glass of water. Make sure to avoid vitamins or supplements for at least 4 hours before and 4 hours after taking medications. Repeat labs in 3 months if adjustments are made and in 6 months if stable.   -     Thyroid Panel With TSH  Mixed hyperlipidemia Diet encouraged - increase intake of fresh fruits and vegetables, increase intake of lean proteins. Bake, broil, or grill foods. Avoid fried, greasy, and fatty foods. Avoid fast foods. Increase intake of fiber-rich whole  grains. Exercise encouraged - at least 150 minutes per week and advance as tolerated.  Goal BMI < 25. Continue medications as prescribed. Follow up in 3-6 months as discussed.  -     CMP14+EGFR -     Lipid panel  Essential (primary) hypertension BP well controlled. Changes were not made in regimen today. Goal BP is 130/80. Pt aware to report any persistent high or low readings. DASH diet and exercise encouraged. Exercise at least 150 minutes per week and increase as tolerated. Goal BMI > 25. Stress management encouraged. Avoid nicotine and tobacco product use. Avoid excessive alcohol and NSAID's. Avoid more than 2000 mg of sodium daily. Medications as prescribed. Follow up as scheduled.  -     CBC with Differential/Platelet -     CMP14+EGFR  Vitamin D deficiency Labs pending. Continue repletion therapy. If indicated, will change repletion dosage. Eat foods rich in Vit D including milk, orange juice, yogurt with vitamin D added, salmon or mackerel, canned tuna fish, cereals with vitamin D added, and cod liver oil. Get out in the sun but make sure to wear at least SPF 30 sunscreen.  -     VITAMIN D 25 Hydroxy (Vit-D Deficiency, Fractures)  Vitamin B12 deficiency Will check labs today and adjust repletion therapy if warranted.  -     Vitamin B12     Continue all other maintenance medications.  Follow up plan: Return in 3 months (on 12/18/2021), or if symptoms worsen or fail to improve.   Continue healthy lifestyle choices, including diet (rich in fruits, vegetables, and lean proteins, and low in salt and simple carbohydrates) and exercise (at least 30 minutes of moderate physical activity daily).  Educational handout given for bronchospasm  The above assessment and management plan was discussed with the patient. The patient verbalized understanding of and has agreed to the management plan. Patient is aware to call the clinic if they develop any new symptoms or if symptoms persist or  worsen. Patient is aware when to return to the clinic for a follow-up visit. Patient educated on when it is appropriate to go to the emergency department.   Monia Pouch,  FNP-C [place] [phone]

## 2021-09-18 LAB — CBC WITH DIFFERENTIAL/PLATELET
Basophils Absolute: 0.1 10*3/uL (ref 0.0–0.2)
Basos: 1 %
EOS (ABSOLUTE): 0.2 10*3/uL (ref 0.0–0.4)
Eos: 4 %
Hematocrit: 37.3 % (ref 34.0–46.6)
Hemoglobin: 12.3 g/dL (ref 11.1–15.9)
Immature Grans (Abs): 0 10*3/uL (ref 0.0–0.1)
Immature Granulocytes: 0 %
Lymphocytes Absolute: 1.5 10*3/uL (ref 0.7–3.1)
Lymphs: 23 %
MCH: 28.7 pg (ref 26.6–33.0)
MCHC: 33 g/dL (ref 31.5–35.7)
MCV: 87 fL (ref 79–97)
Monocytes Absolute: 0.8 10*3/uL (ref 0.1–0.9)
Monocytes: 12 %
Neutrophils Absolute: 4 10*3/uL (ref 1.4–7.0)
Neutrophils: 60 %
Platelets: 216 10*3/uL (ref 150–450)
RBC: 4.28 x10E6/uL (ref 3.77–5.28)
RDW: 13.3 % (ref 11.7–15.4)
WBC: 6.5 10*3/uL (ref 3.4–10.8)

## 2021-09-18 LAB — CMP14+EGFR
ALT: 11 IU/L (ref 0–32)
AST: 18 IU/L (ref 0–40)
Albumin/Globulin Ratio: 1.7 (ref 1.2–2.2)
Albumin: 4.1 g/dL (ref 3.7–4.7)
Alkaline Phosphatase: 45 IU/L (ref 44–121)
BUN/Creatinine Ratio: 18 (ref 12–28)
BUN: 21 mg/dL (ref 8–27)
Bilirubin Total: 0.5 mg/dL (ref 0.0–1.2)
CO2: 26 mmol/L (ref 20–29)
Calcium: 9.6 mg/dL (ref 8.7–10.3)
Chloride: 98 mmol/L (ref 96–106)
Creatinine, Ser: 1.16 mg/dL — ABNORMAL HIGH (ref 0.57–1.00)
Globulin, Total: 2.4 g/dL (ref 1.5–4.5)
Glucose: 86 mg/dL (ref 70–99)
Potassium: 4.1 mmol/L (ref 3.5–5.2)
Sodium: 136 mmol/L (ref 134–144)
Total Protein: 6.5 g/dL (ref 6.0–8.5)
eGFR: 47 mL/min/{1.73_m2} — ABNORMAL LOW (ref >59)

## 2021-09-18 LAB — LIPID PANEL
Chol/HDL Ratio: 2.9 ratio (ref 0.0–4.4)
Cholesterol, Total: 163 mg/dL (ref 100–199)
HDL: 56 mg/dL (ref >39)
LDL Chol Calc (NIH): 83 mg/dL (ref 0–99)
Triglycerides: 136 mg/dL (ref 0–149)
VLDL Cholesterol Cal: 24 mg/dL (ref 5–40)

## 2021-09-18 LAB — THYROID PANEL WITH TSH
Free Thyroxine Index: 2.3 (ref 1.2–4.9)
T3 Uptake Ratio: 27 % (ref 24–39)
T4, Total: 8.6 ug/dL (ref 4.5–12.0)
TSH: 3.07 u[IU]/mL (ref 0.450–4.500)

## 2021-09-18 LAB — VITAMIN D 25 HYDROXY (VIT D DEFICIENCY, FRACTURES): Vit D, 25-Hydroxy: 35.3 ng/mL (ref 30.0–100.0)

## 2021-09-18 LAB — VITAMIN B12: Vitamin B-12: 803 pg/mL (ref 232–1245)

## 2021-10-14 ENCOUNTER — Encounter: Payer: Self-pay | Admitting: Internal Medicine

## 2021-10-14 ENCOUNTER — Ambulatory Visit: Payer: Medicare PPO | Admitting: Internal Medicine

## 2021-10-14 VITALS — BP 120/80 | HR 54 | Ht 65.0 in | Wt 169.0 lb

## 2021-10-14 DIAGNOSIS — R053 Chronic cough: Secondary | ICD-10-CM

## 2021-10-14 NOTE — Progress Notes (Signed)
Alexandria Ellison    [id number]    08-15-1940  Primary Care Physician:Rakes, Connye Burkitt, FNP  Referring Physician: Baruch Gouty, [address] [place],  [place] [address] Reason for Consultation: coughing and shortness of breath Date of Consultation: 10/14/2021  Chief complaint:   Chief Complaint  Patient presents with   Consult    Consult: Cough, a little wheezing some SOB.     HPI: Alexandria Ellison is a 81 y.o. woman here for new patient evaluation of cough, wheezing and shortness of breath.  Symptoms have been going on for about a year on and off. Worse with wearing a mask.   She has cough with sore throat, yellowish mucus production and tickling in her throat. She has previously had feelings of congestion in her chest. She had wheezing around this time and ear pain as well. Denies worsening at night time.   PCP prescribed singulair but she had trouble sleeping didn't take it and took mucinex instead.   She was hospitalized at [facility] when shew as 81 years old with a pneumothorax.   She does have a history of seasonal allergies.   No childhood asthma.  Never been treated for bronchitis or pneumonia.   Son has asthma.   She has occasional reflux and has to take omeprazole, takes every other day.    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  Smoking history: former casual smoker, never regular. Nothing sine the 1980s  Social History   Occupational History   Occupation: retired  Tobacco Use   Smoking status: Former    Types: Cigarettes   Smokeless tobacco: Former   Tobacco comments:    Social smoker for years quit in 1990's  Vaping Use   Vaping Use: Never used  Substance and Sexual Activity   Alcohol use: No   Drug use: No   Sexual activity: Yes    Birth control/protection: Surgical    Relevant family history:  Family History  Problem Relation Age of Onset   Stomach cancer  Mother    Colon cancer Father    Hypertension Sister    Cancer Brother        PROSTATE   Hypertension Brother    Asthma Son     Past Medical History:  Diagnosis Date   Bilateral dry eyes 08/06/2020   Chronic kidney disease 08/06/2020   stage 3 a ckd   Colon polyps    H/O seasonal allergies    History of migraine    many yrs ago per pt on 08-06-2020   Hypercholesteremia    Hypertension 08/06/2020   Hypothyroidism    Lesion of bladder    Pneumothorax    Spontaneous,at age 45 left lung   Skin 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-21 legs, 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, basal cell   UTI (urinary tract infection) 04/2020   Wears partial dentures 08/06/2020   upper and lower    Past Surgical History:  Procedure Laterality Date   APPENDECTOMY  1973   COLONOSCOPY  07/13/2011   Procedure: COLONOSCOPY;  Surgeon: Rogene Houston, MD;  [facility];  Service: Endoscopy;  Laterality: N/A;  730   COLONOSCOPY W/ POLYPECTOMY     colonscopy  2018   no polyps removed   CYSTOSCOPY WITH BIOPSY N/A 08/11/2020   Procedure: CYSTOSCOPY WITH BIOPSY;  Surgeon: Claudia Desanctis,  Johny Chess, MD;  Location: [facility];  Service: Urology;  Laterality: N/A;  [place] N/A 08/11/2020   Procedure: CYSTOSCOPY WITH FULGERATION;  Surgeon: Robley Fries, MD;  Location: [facility];  Service: Urology;  Laterality: N/A;   TUBAL LIGATION  1973   UPPER GI ENDOSCOPY     2018     Physical Exam: Blood pressure 120/80, pulse (!) 54, height '5\' 5"'$  (1.651 m), weight 169 lb (76.7 kg), SpO2 98 %. Gen:      No acute distress ENT:  mallampati IV, no nasal polyps, mucus membranes moist, left ear drum clear, right ear canal obstructed with cerumen Lungs:    No increased respiratory effort, symmetric chest wall excursion, clear to auscultation bilaterally, no wheezes or crackles CV:         Regular rate and rhythm; no murmurs, rubs, or gallops.  No pedal edema Abd:      +  bowel sounds; soft, non-tender; no distension MSK: no acute synovitis of DIP or PIP joints, no mechanics hands.  Skin:      Warm and dry; no rashes Neuro: normal speech, no focal facial asymmetry Psych: alert and oriented x3, normal mood and affect   Data Reviewed/Medical Decision Making:  Independent interpretation of tests: Imaging:  PFTs:    Labs:  Lab Results  Component Value Date   WBC 6.5 09/17/2021   HGB 12.3 09/17/2021   HCT 37.3 09/17/2021   MCV 87 09/17/2021   PLT 216 09/17/2021   Lab Results  Component Value Date   NA 136 09/17/2021   K 4.1 09/17/2021   CL 98 09/17/2021   CO2 26 09/17/2021     Immunization status:  Immunization History  Administered Date(s) Administered   Influenza Whole 01/11/2000, 12/10/2017, 03/13/2019   Influenza,inj,Quad PF,6+ Mos 12/04/2018   PFIZER SARS-COV-2 Pediatric Vaccination 5-36yr 02/11/2020   Pneumococcal Conjugate-13 04/22/2017   Pneumococcal Polysaccharide-23 01/19/2018     I reviewed prior external note(s) from family medicine  I reviewed the result(s) of the labs and imaging as noted above.   I have ordered   Assessment:  Chronic cough, likely related to post nasal drainage from allergic rhinitis   Plan/Recommendations: If you develop symptoms of shortness of breath, or If your cough doesn't get better with the below interventions come see me again. Consider asthma and pfts at that time.   I think your cough is related to allergies.   Try taking singular (montelukast) in the late morning around 10am-12p. You can take loratidine (claritin) at night time on the days your cough is acting up.   This should help the ear pain, sore throat, nasal drainage and cough with wheezing.   We discussed disease management and progression at length today.    I spent 40 minutes in the care of this patient today including pre-charting, chart review, review of results, face-to-face care, coordination of care and  communication with consultants etc.).  Return to Care: Return if symptoms worsen or fail to improve.  NLenice Llamas MD Pulmonary and [place] [place] CC: Rakes, LConnye Burkitt FNP

## 2021-10-14 NOTE — Patient Instructions (Signed)
Follow up with me as needed.   If you develop symptoms of shortness of breath, or If your cough doesn't get better with the below interventions come see me again.  I think your cough is related to allergies.   Try taking singular (montelukast) in the late morning around 10am-12p. You can take loratidine (claritin) at night time on the days your cough is acting up.   This should help the ear pain, sore throat, nasal drainage and cough with wheezing.

## 2021-11-11 ENCOUNTER — Encounter: Payer: Self-pay | Admitting: Family Medicine

## 2021-11-11 ENCOUNTER — Ambulatory Visit: Payer: Medicare PPO | Admitting: Family Medicine

## 2021-11-11 ENCOUNTER — Ambulatory Visit: Payer: Medicare PPO

## 2021-11-11 VITALS — BP 132/73 | HR 67 | Temp 98.5°F | Ht 65.0 in | Wt 172.0 lb

## 2021-11-11 DIAGNOSIS — J209 Acute bronchitis, unspecified: Secondary | ICD-10-CM | POA: Diagnosis not present

## 2021-11-11 MED ORDER — AZITHROMYCIN 250 MG PO TABS: ORAL_TABLET | ORAL | 0 refills | Status: DC

## 2021-11-11 MED ORDER — ALBUTEROL SULFATE HFA 108 (90 BASE) MCG/ACT IN AERS: 2.0000 | INHALATION_SPRAY | Freq: Four times a day (QID) | RESPIRATORY_TRACT | 2 refills | Status: DC | PRN

## 2021-11-11 MED ORDER — PREDNISONE 20 MG PO TABS: ORAL_TABLET | ORAL | 0 refills | Status: DC

## 2021-11-11 NOTE — Progress Notes (Signed)
Subjective:  Patient ID: Alexandria Ellison, female    DOB: [birth year], 81 y.o.   MRN: [id number]  Patient Care Team: Baruch Gouty, FNP as PCP - General (Family Medicine)   Chief Complaint:  Cough (Patient describes Croup cough/X 3 days/Mucinex, singulair, claritin)   HPI: Alexandria Ellison is a 81 y.o. female presenting on 11/11/2021 for Cough (Patient describes Croup cough/X 3 days/Mucinex, singulair, claritin)   Pt presents today with complaints of ongoing cough now with wheezing. Worse at night. Has been using mucinex, claritin, and singulair without relief of symptoms.   Cough This is a recurrent problem. The current episode started 1 to 4 weeks ago. The problem has been waxing and waning. The cough is Productive of sputum. Associated symptoms include postnasal drip and wheezing. Pertinent negatives include no chest pain, chills, ear congestion, ear pain, fever, headaches, heartburn, hemoptysis, myalgias, nasal congestion, rash, rhinorrhea, sore throat, shortness of breath, sweats or weight loss. Nothing aggravates the symptoms. She has tried leukotriene antagonists (antihistamine, PPI) for the symptoms. The treatment provided no relief.    Relevant past medical, surgical, family, and social history reviewed and updated as indicated.  Allergies and medications reviewed and updated. Data reviewed: Chart in Epic.   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, basal cell   UTI (urinary tract infection) 04/2020   Wears partial dentures 08/06/2020   upper and lower    Past Surgical History:  Procedure  Laterality Date   APPENDECTOMY  1973   COLONOSCOPY  07/13/2011   Procedure: COLONOSCOPY;  Surgeon: Rogene Houston, MD;  Location: [facility];  Service: Endoscopy;  Laterality: N/A;  730   COLONOSCOPY W/ POLYPECTOMY     colonscopy  2018   no polyps removed   CYSTOSCOPY WITH BIOPSY N/A 08/11/2020   Procedure: CYSTOSCOPY WITH BIOPSY;  Surgeon: Robley Fries, MD;  Location: [facility];  Service: Urology;  Laterality: N/A;  [place] N/A 08/11/2020   Procedure: CYSTOSCOPY WITH FULGERATION;  Surgeon: Robley Fries, MD;  Location: [facility];  Service: Urology;  Laterality: N/A;   TUBAL LIGATION  1973   UPPER GI ENDOSCOPY     2018    Social History   Socioeconomic History   Marital status: Married    Spouse name: Not on file   Number of children: 2   Years of education: Not on file   Highest education level: Some college, no degree  Occupational History   Occupation: retired  Tobacco Use   Smoking status: Former    Types: Cigarettes   Smokeless tobacco: Former   Tobacco comments:    Social smoker for years quit in 1990's  Vaping Use   Vaping Use: Never used  Substance and Sexual Activity   Alcohol use: No   Drug use: No   Sexual activity: Yes    Birth control/protection: Surgical  Other Topics Concern   Not  on file  Social History Narrative   Lives part time in [place], part time in [place] in [place] Dr Livia Snellen when here, sees Dr Walker in [place] when living there   Married to [place] Determinants of Health   Financial Resource Strain: Low Risk  (02/17/2021)   Overall Financial Resource Strain (CARDIA)    Difficulty of Paying Living Expenses: Not hard at all  Food Insecurity: No Food Insecurity (02/17/2021)   Hunger Vital Sign    Worried About Running Out of Food in the Last Year: Never true    Ran Out of Food in the Last Year: Never true  Transportation Needs: No Transportation  Needs (02/17/2021)   PRAPARE - Hydrologist (Medical): No    Lack of Transportation (Non-Medical): No  Physical Activity: Insufficiently Active (02/17/2021)   Exercise Vital Sign    Days of Exercise per Week: 7 days    Minutes of Exercise per Session: 20 min  Stress: No Stress Concern Present (02/17/2021)   Pound    Feeling of Stress : Not at all  Social Connections: Moderately Isolated (02/17/2021)   Social Connection and Isolation Panel [NHANES]    Frequency of Communication with Friends and Family: More than three times a week    Frequency of Social Gatherings with Friends and Family: Twice a week    Attends Religious Services: Never    Marine scientist or Organizations: No    Attends Archivist Meetings: Never    Marital Status: Married  Human resources officer Violence: Not At Risk (02/17/2021)   Humiliation, Afraid, Rape, and Kick questionnaire    Fear of Current or Ex-Partner: No    Emotionally Abused: No    Physically Abused: No    Sexually Abused: No    Outpatient Encounter Medications as of 11/11/2021  Medication Sig   Acetaminophen (TYLENOL ARTHRITIS PAIN PO) Take 500 mg by mouth as needed. Takes 2 of 500 mg at qhs prn   albuterol (VENTOLIN HFA) 108 (90 Base) MCG/ACT inhaler Inhale 2 puffs into the lungs every 6 (six) hours as needed for wheezing or shortness of breath.   amLODipine (NORVASC) 2.5 MG tablet Take 2.5 mg by mouth every morning.   azithromycin (ZITHROMAX Z-PAK) 250 MG tablet As directed   Cholecalciferol (VITAMIN D3 PO) Take by mouth.   Cyanocobalamin (B-12 PO) Take by mouth.   cyclobenzaprine (FLEXERIL) 10 MG tablet Take 1 tablet (10 mg total) by mouth 3 (three) times daily as needed for muscle spasms.   cycloSPORINE (RESTASIS) 0.05 % ophthalmic emulsion 1 drop 2 (two) times daily.   Emu Oil OIL by Does not apply route. To legs at hs   famotidine  (PEPCID) 20 MG tablet Take 20 mg by mouth at bedtime.   fluorouracil (EFUDEX) 5 % cream Apply topically 2 (two) times daily.   gabapentin (NEURONTIN) 100 MG capsule Take 1 capsule (100 mg total) by mouth at bedtime.   hydrochlorothiazide (HYDRODIURIL) 12.5 MG tablet Take 1 tablet every day by oral route.   levothyroxine (SYNTHROID) 75 MCG tablet Take 75 mcg by mouth daily.   losartan (COZAAR) 100 MG tablet Take 1 tablet by mouth daily.   magnesium oxide (MAG-OX) 400 MG tablet Take 400 mg by mouth daily.   montelukast (SINGULAIR) 10 MG tablet Take 1 tablet (10 mg total) by mouth at bedtime.   nystatin-triamcinolone (MYCOLOG II) cream APPLY  TO AFFECTED AREA TWICE A DAY IN THE MORNING AND IN THE EVENING   omeprazole (PRILOSEC) 20 MG capsule Take 1 capsule (20 mg total) by mouth daily.   predniSONE (DELTASONE) 20 MG tablet 2 po at sametime daily for 5 days- start tomorrow   rosuvastatin (CRESTOR) 5 MG tablet Take 5 mg by mouth daily.   vitamin C (ASCORBIC ACID) 500 MG tablet Take 500 mg by mouth daily.   [DISCONTINUED] hydrochlorothiazide (HYDRODIURIL) 25 MG tablet Take 25 mg by mouth daily.   No facility-administered encounter medications on file as of 11/11/2021.    Allergies  Allergen Reactions   Aspirin Palpitations    No reaction with low dose 81 mg aspirin   Phenobarbital Palpitations    Review of Systems  Constitutional:  Negative for activity change, appetite change, chills, diaphoresis, fatigue, fever, unexpected weight change and weight loss.  HENT:  Positive for postnasal drip. Negative for congestion, dental problem, drooling, ear pain, facial swelling, hearing loss, mouth sores, nosebleeds, rhinorrhea, sinus pressure, sinus pain, sneezing, sore throat, tinnitus, trouble swallowing and voice change.   Eyes:  Negative for photophobia and visual disturbance.  Respiratory:  Positive for cough and wheezing. Negative for apnea, hemoptysis, choking, chest tightness and shortness of  breath.   Cardiovascular:  Negative for chest pain, palpitations and leg swelling.  Gastrointestinal:  Negative for abdominal pain, constipation, diarrhea, heartburn, nausea and vomiting.  Genitourinary:  Negative for decreased urine volume and difficulty urinating.  Musculoskeletal:  Negative for arthralgias and myalgias.  Skin: Negative.  Negative for rash.  Neurological:  Negative for dizziness, tremors, seizures, syncope, facial asymmetry, speech difficulty, weakness, light-headedness, numbness and headaches.  Psychiatric/Behavioral:  Negative for confusion.   All other systems reviewed and are negative.       Objective:  BP 132/73   Pulse 67   Temp 98.5 F (36.9 C)   Ht _0  (1.651 m)   Wt 172 lb (78 kg)   SpO2 97%   BMI 28.62 kg/m    Wt Readings from Last 3 Encounters:  11/11/21 172 lb (78 kg)  10/14/21 169 lb (76.7 kg)  09/17/21 169 lb (76.7 kg)    Physical Exam Vitals and nursing note reviewed.  Constitutional:      General: She is not in acute distress.    Appearance: Normal appearance. She is well-developed and well-groomed. She is not ill-appearing, toxic-appearing or diaphoretic.  HENT:     Head: Normocephalic and atraumatic.     Right Ear: Tympanic membrane, ear canal and external ear normal.     Left Ear: Tympanic membrane, ear canal and external ear normal.     Mouth/Throat:     Mouth: Mucous membranes are moist.  Eyes:     Pupils: Pupils are equal, round, and reactive to light.  Cardiovascular:     Rate and Rhythm: Normal rate and regular rhythm.     Heart sounds: Normal heart sounds. No murmur heard.    No gallop.  Pulmonary:     Effort: Pulmonary effort is normal.     Breath sounds: No stridor. Wheezing (mild, expiratory) present. No rhonchi or rales.  Chest:     Chest wall: No tenderness.  Musculoskeletal:     Cervical back: Normal range of motion and neck supple.     Right lower leg: No edema.     Left lower leg: No edema.  Skin:     General: Skin is warm and dry.     Capillary Refill: Capillary  refill takes less than 2 seconds.  Neurological:     General: No focal deficit present.     Mental Status: She is alert and oriented to person, place, and time.  Psychiatric:        Mood and Affect: Mood normal.        Behavior: Behavior normal. Behavior is cooperative.        Thought Content: Thought content normal.        Judgment: Judgment normal.     Results for orders placed or performed in visit on 09/17/21  CBC with Differential/Platelet  Result Value Ref Range   WBC 6.5 3.4 - 10.8 x10E3/uL   RBC 4.28 3.77 - 5.28 x10E6/uL   Hemoglobin 12.3 11.1 - 15.9 g/dL   Hematocrit 37.3 34.0 - 46.6 %   MCV 87 79 - 97 fL   MCH 28.7 26.6 - 33.0 pg   MCHC 33.0 31.5 - 35.7 g/dL   RDW 13.3 11.7 - 15.4 %   Platelets 216 150 - 450 x10E3/uL   Neutrophils 60 Not Estab. %   Lymphs 23 Not Estab. %   Monocytes 12 Not Estab. %   Eos 4 Not Estab. %   Basos 1 Not Estab. %   Neutrophils Absolute 4.0 1.4 - 7.0 x10E3/uL   Lymphocytes Absolute 1.5 0.7 - 3.1 x10E3/uL   Monocytes Absolute 0.8 0.1 - 0.9 x10E3/uL   EOS (ABSOLUTE) 0.2 0.0 - 0.4 x10E3/uL   Basophils Absolute 0.1 0.0 - 0.2 x10E3/uL   Immature Granulocytes 0 Not Estab. %   Immature Grans (Abs) 0.0 0.0 - 0.1 x10E3/uL  CMP14+EGFR  Result Value Ref Range   Glucose 86 70 - 99 mg/dL   BUN 21 8 - 27 mg/dL   Creatinine, Ser 1.16 (H) 0.57 - 1.00 mg/dL   eGFR 47 (L) >59 mL/min/1.73   BUN/Creatinine Ratio 18 12 - 28   Sodium 136 134 - 144 mmol/L   Potassium 4.1 3.5 - 5.2 mmol/L   Chloride 98 96 - 106 mmol/L   CO2 26 20 - 29 mmol/L   Calcium 9.6 8.7 - 10.3 mg/dL   Total Protein 6.5 6.0 - 8.5 g/dL   Albumin 4.1 3.7 - 4.7 g/dL   Globulin, Total 2.4 1.5 - 4.5 g/dL   Albumin/Globulin Ratio 1.7 1.2 - 2.2   Bilirubin Total 0.5 0.0 - 1.2 mg/dL   Alkaline Phosphatase 45 44 - 121 IU/L   AST 18 0 - 40 IU/L   ALT 11 0 - 32 IU/L  Lipid panel  Result Value Ref Range   Cholesterol,  Total 163 100 - 199 mg/dL   Triglycerides 136 0 - 149 mg/dL   HDL 56 >39 mg/dL   VLDL Cholesterol Cal 24 5 - 40 mg/dL   LDL Chol Calc (NIH) 83 0 - 99 mg/dL   Chol/HDL Ratio 2.9 0.0 - 4.4 ratio  Thyroid Panel With TSH  Result Value Ref Range   TSH 3.070 0.450 - 4.500 uIU/mL   T4, Total 8.6 4.5 - 12.0 ug/dL   T3 Uptake Ratio 27 24 - 39 %   Free Thyroxine Index 2.3 1.2 - 4.9  Vitamin B12  Result Value Ref Range   Vitamin B-12 803 232 - 1,245 pg/mL  VITAMIN D 25 Hydroxy (Vit-D Deficiency, Fractures)  Result Value Ref Range   Vit D, 25-Hydroxy 35.3 30.0 - 100.0 ng/mL       Pertinent labs & imaging results that were available during my care of the patient were  reviewed by me and considered in my medical decision making.  Assessment & Plan:  Alexandria Ellison was seen today for cough.  Diagnoses and all orders for this visit:  Bronchitis, acute, with bronchospasm Viral testing pending but due to length of symptoms, will start Zithromax. Pt aware to take as prescribed. Will burst with steroids and provide Albuterol inhaler for as needed use. Pt aware to report new, worsening, or persistent symptoms.  -     COVID-19, Flu A+B and RSV -     azithromycin (ZITHROMAX Z-PAK) 250 MG tablet; As directed -     predniSONE (DELTASONE) 20 MG tablet; 2 po at sametime daily for 5 days- start tomorrow -     albuterol (VENTOLIN HFA) 108 (90 Base) MCG/ACT inhaler; Inhale 2 puffs into the lungs every 6 (six) hours as needed for wheezing or shortness of breath.     Continue all other maintenance medications.  Follow up plan: Return if symptoms worsen or fail to improve.   Continue healthy lifestyle choices, including diet (rich in fruits, vegetables, and lean proteins, and low in salt and simple carbohydrates) and exercise (at least 30 minutes of moderate physical activity daily).  Educational handout given for bronchitis   The above assessment and management plan was discussed with the patient. The patient  verbalized understanding of and has agreed to the management plan. Patient is aware to call the clinic if they develop any new symptoms or if symptoms persist or worsen. Patient is aware when to return to the clinic for a follow-up visit. Patient educated on when it is appropriate to go to the emergency department.   Monia Pouch, FNP-C [facility] [phone]

## 2021-11-12 LAB — COVID-19, FLU A+B AND RSV
Influenza A, NAA: NOT DETECTED
Influenza B, NAA: NOT DETECTED
RSV, NAA: NOT DETECTED
SARS-CoV-2, NAA: NOT DETECTED

## 2021-12-08 ENCOUNTER — Other Ambulatory Visit: Payer: Self-pay | Admitting: Family Medicine

## 2021-12-08 DIAGNOSIS — K219 Gastro-esophageal reflux disease without esophagitis: Secondary | ICD-10-CM

## 2021-12-09 ENCOUNTER — Encounter: Payer: Self-pay | Admitting: Family Medicine

## 2021-12-09 ENCOUNTER — Ambulatory Visit: Payer: Medicare PPO | Admitting: Family Medicine

## 2021-12-09 ENCOUNTER — Ambulatory Visit (INDEPENDENT_AMBULATORY_CARE_PROVIDER_SITE_OTHER): Payer: Medicare PPO

## 2021-12-09 VITALS — BP 136/83 | HR 86 | Temp 97.9°F | Ht 65.0 in | Wt 174.0 lb

## 2021-12-09 DIAGNOSIS — R062 Wheezing: Secondary | ICD-10-CM

## 2021-12-09 DIAGNOSIS — R5381 Other malaise: Secondary | ICD-10-CM | POA: Diagnosis not present

## 2021-12-09 DIAGNOSIS — E538 Deficiency of other specified B group vitamins: Secondary | ICD-10-CM

## 2021-12-09 DIAGNOSIS — J40 Bronchitis, not specified as acute or chronic: Secondary | ICD-10-CM | POA: Diagnosis not present

## 2021-12-09 DIAGNOSIS — R5383 Other fatigue: Secondary | ICD-10-CM

## 2021-12-09 DIAGNOSIS — N1831 Chronic kidney disease, stage 3a: Secondary | ICD-10-CM

## 2021-12-09 DIAGNOSIS — E559 Vitamin D deficiency, unspecified: Secondary | ICD-10-CM

## 2021-12-09 DIAGNOSIS — E039 Hypothyroidism, unspecified: Secondary | ICD-10-CM | POA: Diagnosis not present

## 2021-12-09 MED ORDER — METHYLPREDNISOLONE ACETATE 40 MG/ML IJ SUSP
40.0000 mg | Freq: Once | INTRAMUSCULAR | Status: AC
  Administered 2021-12-09: 40 mg via INTRAMUSCULAR

## 2021-12-09 MED ORDER — DOXYCYCLINE HYCLATE 100 MG PO TABS: 100.0000 mg | ORAL_TABLET | Freq: Two times a day (BID) | ORAL | 0 refills | Status: AC

## 2021-12-09 NOTE — Progress Notes (Signed)
Subjective:  Patient ID: Alexandria Ellison, female    DOB: 1941/02/17, 81 y.o.   MRN: [id number]  Patient Care Team: Baruch Gouty, FNP as PCP - General (Family Medicine)   Chief Complaint:  Fatigue (Bilateral leg weakness and body weakness since having bronchitis. Weakness and shaky )   HPI: Alexandria Ellison is a 81 y.o. female presenting on 12/09/2021 for Fatigue (Bilateral leg weakness and body weakness since having bronchitis. Weakness and shaky )   Pt presents today with complaints of fatigue, malaise, continued cough and wheezing. States she took all of the medications which were recently prescribed for bronchitis - prednisone, albuterol inhaler, and zithromax. She felt better for a few days and then started feeling bad again. She feels weak and fatigued. Feels like she does not have the energy to cook or clean. No reported fevers but has had chills. She states she is complaint with her thyroid medications and vitamin repletion therapy. No changes in urine output. No confusion or leg swelling.      Relevant past medical, surgical, family, and social history reviewed and updated as indicated.  Allergies and medications reviewed and updated. Data reviewed: Chart in Epic.   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, basal cell   UTI (urinary tract infection) 04/2020   Wears partial dentures 08/06/2020   upper and lower    Past Surgical History:  Procedure Laterality Date   APPENDECTOMY  1973   COLONOSCOPY  07/13/2011   Procedure: COLONOSCOPY;  Surgeon: Rogene Houston, MD;  [facility];  Service: Endoscopy;  Laterality: N/A;  730   COLONOSCOPY W/ POLYPECTOMY     colonscopy  2018   no polyps removed   CYSTOSCOPY WITH BIOPSY N/A 08/11/2020   Procedure: CYSTOSCOPY WITH BIOPSY;  Surgeon: Robley Fries, MD;  Location: [facility];  Service: Urology;  Laterality: N/A;  [place] N/A 08/11/2020   Procedure: CYSTOSCOPY WITH FULGERATION;  Surgeon: Robley Fries, MD;  Location: [facility];  Service: Urology;  Laterality: N/A;   TUBAL LIGATION  1973   UPPER GI ENDOSCOPY     2018    Social History   Socioeconomic History   Marital status: Married    Spouse name: Not on file   Number of children: 2   Years of education: Not on file   Highest education level: Some college, no degree  Occupational History   Occupation: retired  Tobacco Use   Smoking status: Former    Types: Cigarettes   Smokeless tobacco: Former   Tobacco comments:    Social smoker for years quit in 1990's  Vaping Use   Vaping Use: Never used  Substance and Sexual Activity   Alcohol use: No   Drug use: No   Sexual activity: Yes    Birth control/protection: Surgical  Other Topics Concern   Not on file  Social History Narrative  Lives part time in [place], part time in [place] in [place] Dr Livia Snellen when here, sees Dr Dede Query in [place] when living there   Married to [place] Determinants of Health   Financial Resource Strain: Low Risk  (02/17/2021)   Overall Financial Resource Strain (CARDIA)    Difficulty of Paying Living Expenses: Not hard at all  Food Insecurity: No Food Insecurity (02/17/2021)   Hunger Vital Sign    Worried About Running Out of Food in the Last Year: Never true    [place] in the Last Year: Never true  Transportation Needs: No Transportation Needs (02/17/2021)   PRAPARE - Hydrologist (Medical): No    Lack of Transportation (Non-Medical): No  Physical  Activity: Insufficiently Active (02/17/2021)   Exercise Vital Sign    Days of Exercise per Week: 7 days    Minutes of Exercise per Session: 20 min  Stress: No Stress Concern Present (02/17/2021)   [place]    Feeling of Stress : Not at all  Social Connections: Moderately Isolated (02/17/2021)   Social Connection and Isolation Panel [NHANES]    Frequency of Communication with Friends and Family: More than three times a week    Frequency of Social Gatherings with Friends and Family: Twice a week    Attends Religious Services: Never    Marine scientist or Organizations: No    Attends Archivist Meetings: Never    Marital Status: Married  Human resources officer Violence: Not At Risk (02/17/2021)   Humiliation, Afraid, Rape, and Kick questionnaire    Fear of Current or Ex-Partner: No    Emotionally Abused: No    Physically Abused: No    Sexually Abused: No    Outpatient Encounter Medications as of 12/09/2021  Medication Sig   Acetaminophen (TYLENOL ARTHRITIS PAIN PO) Take 500 mg by mouth as needed. Takes 2 of 500 mg at qhs prn   amLODipine (NORVASC) 2.5 MG tablet Take 2.5 mg by mouth every morning.   Cholecalciferol (VITAMIN D3 PO) Take by mouth.   Cyanocobalamin (B-12 PO) Take by mouth.   cycloSPORINE (RESTASIS) 0.05 % ophthalmic emulsion 1 drop 2 (two) times daily.   doxycycline (VIBRA-TABS) 100 MG tablet Take 1 tablet (100 mg total) by mouth 2 (two) times daily for 10 days. 1 po bid   Emu Oil OIL by Does not apply route. To legs at hs   fluorouracil (EFUDEX) 5 % cream Apply topically 2 (two) times daily.   hydrochlorothiazide (HYDRODIURIL) 12.5 MG tablet Take 1 tablet every day by oral route.   levothyroxine (SYNTHROID) 75 MCG tablet Take 75 mcg by mouth daily.   losartan (COZAAR) 100 MG tablet Take 1 tablet by mouth daily.   magnesium oxide (MAG-OX) 400 MG tablet Take 400 mg by mouth daily.   montelukast  (SINGULAIR) 10 MG tablet Take 1 tablet (10 mg total) by mouth at bedtime.   nystatin-triamcinolone (MYCOLOG II) cream APPLY TO AFFECTED AREA TWICE A DAY IN THE MORNING AND IN THE EVENING   omeprazole (PRILOSEC) 20 MG capsule Take 1 capsule (20 mg total) by mouth daily. (NEEDS TO BE SEEN BEFORE NEXT REFILL)   rosuvastatin (CRESTOR) 5 MG tablet Take 5 mg by mouth daily.   vitamin C (ASCORBIC ACID) 500 MG tablet Take 500 mg by mouth daily.   famotidine (PEPCID) 20 MG tablet Take 20 mg by mouth  at bedtime. (Patient not taking: Reported on 12/09/2021)   [DISCONTINUED] albuterol (VENTOLIN HFA) 108 (90 Base) MCG/ACT inhaler Inhale 2 puffs into the lungs every 6 (six) hours as needed for wheezing or shortness of breath.   [DISCONTINUED] azithromycin (ZITHROMAX Z-PAK) 250 MG tablet As directed   [DISCONTINUED] cyclobenzaprine (FLEXERIL) 10 MG tablet Take 1 tablet (10 mg total) by mouth 3 (three) times daily as needed for muscle spasms.   [DISCONTINUED] gabapentin (NEURONTIN) 100 MG capsule Take 1 capsule (100 mg total) by mouth at bedtime.   [DISCONTINUED] predniSONE (DELTASONE) 20 MG tablet 2 po at sametime daily for 5 days- start tomorrow   [EXPIRED] methylPREDNISolone acetate (DEPO-MEDROL) injection 40 mg    No facility-administered encounter medications on file as of 12/09/2021.    Allergies  Allergen Reactions   Aspirin Palpitations    No reaction with low dose 81 mg aspirin   Phenobarbital Palpitations    Review of Systems  Constitutional:  Positive for activity change, appetite change, chills and fatigue. Negative for diaphoresis, fever and unexpected weight change.  HENT: Negative.    Eyes: Negative.  Negative for photophobia and visual disturbance.  Respiratory:  Positive for cough and wheezing. Negative for apnea, choking, chest tightness, shortness of breath and stridor.   Cardiovascular:  Negative for chest pain, palpitations and leg swelling.  Gastrointestinal:  Negative for  abdominal pain, blood in stool, constipation, diarrhea, nausea and vomiting.  Endocrine: Negative.   Genitourinary:  Negative for decreased urine volume, difficulty urinating, dysuria, frequency and urgency.  Musculoskeletal:  Negative for arthralgias and myalgias.  Skin: Negative.   Allergic/Immunologic: Negative.   Neurological:  Positive for weakness (generalized). Negative for dizziness, tremors, seizures, syncope, facial asymmetry, speech difficulty, light-headedness, numbness and headaches.  Hematological: Negative.   Psychiatric/Behavioral:  Negative for confusion, hallucinations, sleep disturbance and suicidal ideas.   All other systems reviewed and are negative.       Objective:  BP 136/83   Pulse 86   Temp 97.9 F (36.6 C) (Temporal)   Ht _0  (1.651 m)   Wt 174 lb (78.9 kg)   SpO2 98%   BMI 28.96 kg/m    Wt Readings from Last 3 Encounters:  12/09/21 174 lb (78.9 kg)  11/11/21 172 lb (78 kg)  10/14/21 169 lb (76.7 kg)    Physical Exam Vitals and nursing note reviewed.  Constitutional:      General: She is not in acute distress.    Appearance: Normal appearance. She is well-developed and well-groomed. She is not ill-appearing, toxic-appearing or diaphoretic.  HENT:     Head: Normocephalic and atraumatic.     Jaw: There is normal jaw occlusion.     Right Ear: Hearing normal.     Left Ear: Hearing normal.     Nose: Nose normal.     Mouth/Throat:     Lips: Pink.     Mouth: Mucous membranes are moist.     Pharynx: Oropharynx is clear. Uvula midline.  Eyes:     General: Lids are normal.     Extraocular Movements: Extraocular movements intact.     Conjunctiva/sclera: Conjunctivae normal.     Pupils: Pupils are equal, round, and reactive to light.  Neck:     Thyroid: No thyroid mass, thyromegaly or thyroid tenderness.     Vascular: No carotid bruit or JVD.     Trachea: Trachea and phonation normal.  Cardiovascular:     Rate and Rhythm: Normal rate and  regular rhythm.  Chest Wall: PMI is not displaced.     Pulses: Normal pulses.     Heart sounds: Normal heart sounds. No murmur heard.    No friction rub. No gallop.  Pulmonary:     Effort: Pulmonary effort is normal. No respiratory distress.     Breath sounds: Wheezing present. No rhonchi or rales.  Abdominal:     General: Bowel sounds are normal. There is no distension or abdominal bruit.     Palpations: Abdomen is soft. There is no hepatomegaly or splenomegaly.     Tenderness: There is no abdominal tenderness. There is no right CVA tenderness or left CVA tenderness.     Hernia: No hernia is present.  Musculoskeletal:        General: Normal range of motion.     Cervical back: Normal range of motion and neck supple.     Right lower leg: No edema.     Left lower leg: No edema.     Comments: Kyphosis  Lymphadenopathy:     Cervical: No cervical adenopathy.  Skin:    General: Skin is warm and dry.     Capillary Refill: Capillary refill takes less than 2 seconds.     Coloration: Skin is not cyanotic, jaundiced or pale.     Findings: No rash.  Neurological:     General: No focal deficit present.     Mental Status: She is alert and oriented to person, place, and time.     Sensory: Sensation is intact.     Motor: Motor function is intact.     Coordination: Coordination is intact.     Gait: Gait is intact.     Deep Tendon Reflexes: Reflexes are normal and symmetric.  Psychiatric:        Attention and Perception: Attention and perception normal.        Mood and Affect: Mood and affect normal.        Speech: Speech normal.        Behavior: Behavior normal. Behavior is cooperative.        Thought Content: Thought content normal.        Cognition and Memory: Cognition and memory normal.        Judgment: Judgment normal.     Results for orders placed or performed in visit on 11/11/21  COVID-19, Flu A+B and RSV   Specimen: Nasopharyngeal(NP) swabs in vial transport medium  Result  Value Ref Range   SARS-CoV-2, NAA Not Detected Not Detected   Influenza A, NAA Not Detected Not Detected   Influenza B, NAA Not Detected Not Detected   RSV, NAA Not Detected Not Detected   Test Information: Comment      X-Ray: CXR: No acute findings. Preliminary x-ray reading by Monia Pouch, FNP-C, [facility].   Pertinent labs & imaging results that were available during my care of the patient were reviewed by me and considered in my medical decision making.  Assessment & Plan:  Jhaniya was seen today for fatigue.  Diagnoses and all orders for this visit:  Wheezing Bronchitis with wheezing Ongoing symptoms despite previous treatment. CXR without pneumonia findings. Will treat for bacterial bronchitis with below. Aware to continue Mucinex and Albuterol inhaler. Report new, worsening, or persistent symptoms.  -     DG Chest 2 View; Future -     doxycycline (VIBRA-TABS) 100 MG tablet; Take 1 tablet (100 mg total) by mouth 2 (two) times daily for 10 days. 1 po bid -  methylPREDNISolone acetate (DEPO-MEDROL) injection 40 mg  Malaise and fatigue Will check labs for potential underlying causes such as anemia, infection, vit deficiencies, or electrolyte imbalances.  -     CBC with Differential/Platelet -     CMP14+EGFR -     Thyroid Panel With TSH -     Vitamin B12 -     VITAMIN D 25 Hydroxy (Vit-D Deficiency, Fractures)  Hypothyroidism (acquired) Thyroid disease has been well controlled. Labs are pending. Adjustments to regimen will be made if warranted. Make sure to take medications on an empty stomach with a full glass of water. Make sure to avoid vitamins or supplements for at least 4 hours before and 4 hours after taking medications. Repeat labs in 3 months if adjustments are made and in 6 months if stable.   -     Thyroid Panel With TSH  Vitamin B12 deficiency Labs pending. Will change repletion therapy if warranted.  -     Vitamin B12  Vitamin D deficiency Labs pending, will  change repletion therapy if warranted. -     VITAMIN D 25 Hydroxy (Vit-D Deficiency, Fractures)  Stage 3a chronic kidney disease ([place]) Labs pending, keep follow up with nephrology.  -     CBC with Differential/Platelet -     CMP14+EGFR -     VITAMIN D 25 Hydroxy (Vit-D Deficiency, Fractures)     Continue all other maintenance medications.  Follow up plan: Return if symptoms worsen or fail to improve.   Continue healthy lifestyle choices, including diet (rich in fruits, vegetables, and lean proteins, and low in salt and simple carbohydrates) and exercise (at least 30 minutes of moderate physical activity daily).  Educational handout given for bronchitis  The above assessment and management plan was discussed with the patient. The patient verbalized understanding of and has agreed to the management plan. Patient is aware to call the clinic if they develop any new symptoms or if symptoms persist or worsen. Patient is aware when to return to the clinic for a follow-up visit. Patient educated on when it is appropriate to go to the emergency department.   Monia Pouch, FNP-C [facility] [phone]

## 2021-12-10 LAB — CBC WITH DIFFERENTIAL/PLATELET
Basophils Absolute: 0.1 10*3/uL (ref 0.0–0.2)
Basos: 1 %
EOS (ABSOLUTE): 0.1 10*3/uL (ref 0.0–0.4)
Eos: 2 %
Hematocrit: 36.7 % (ref 34.0–46.6)
Hemoglobin: 12.1 g/dL (ref 11.1–15.9)
Immature Grans (Abs): 0 10*3/uL (ref 0.0–0.1)
Immature Granulocytes: 0 %
Lymphocytes Absolute: 1.3 10*3/uL (ref 0.7–3.1)
Lymphs: 26 %
MCH: 29.2 pg (ref 26.6–33.0)
MCHC: 33 g/dL (ref 31.5–35.7)
MCV: 88 fL (ref 79–97)
Monocytes Absolute: 0.5 10*3/uL (ref 0.1–0.9)
Monocytes: 9 %
Neutrophils Absolute: 3.1 10*3/uL (ref 1.4–7.0)
Neutrophils: 62 %
Platelets: 214 10*3/uL (ref 150–450)
RBC: 4.15 x10E6/uL (ref 3.77–5.28)
RDW: 13.7 % (ref 11.7–15.4)
WBC: 5.1 10*3/uL (ref 3.4–10.8)

## 2021-12-10 LAB — THYROID PANEL WITH TSH
Free Thyroxine Index: 2.2 (ref 1.2–4.9)
T3 Uptake Ratio: 29 % (ref 24–39)
T4, Total: 7.7 ug/dL (ref 4.5–12.0)
TSH: 5.72 u[IU]/mL — ABNORMAL HIGH (ref 0.450–4.500)

## 2021-12-10 LAB — CMP14+EGFR
ALT: 13 IU/L (ref 0–32)
AST: 21 IU/L (ref 0–40)
Albumin/Globulin Ratio: 1.9 (ref 1.2–2.2)
Albumin: 4 g/dL (ref 3.7–4.7)
Alkaline Phosphatase: 46 IU/L (ref 44–121)
BUN/Creatinine Ratio: 20 (ref 12–28)
BUN: 21 mg/dL (ref 8–27)
Bilirubin Total: 0.4 mg/dL (ref 0.0–1.2)
CO2: 24 mmol/L (ref 20–29)
Calcium: 9.3 mg/dL (ref 8.7–10.3)
Chloride: 102 mmol/L (ref 96–106)
Creatinine, Ser: 1.05 mg/dL — ABNORMAL HIGH (ref 0.57–1.00)
Globulin, Total: 2.1 g/dL (ref 1.5–4.5)
Glucose: 83 mg/dL (ref 70–99)
Potassium: 3.5 mmol/L (ref 3.5–5.2)
Sodium: 141 mmol/L (ref 134–144)
Total Protein: 6.1 g/dL (ref 6.0–8.5)
eGFR: 53 mL/min/{1.73_m2} — ABNORMAL LOW (ref >59)

## 2021-12-10 LAB — VITAMIN B12: Vitamin B-12: 889 pg/mL (ref 232–1245)

## 2021-12-10 LAB — VITAMIN D 25 HYDROXY (VIT D DEFICIENCY, FRACTURES): Vit D, 25-Hydroxy: 29.7 ng/mL — ABNORMAL LOW (ref 30.0–100.0)

## 2021-12-12 NOTE — Progress Notes (Deleted)
Follow up with pulmonology

## 2021-12-30 ENCOUNTER — Other Ambulatory Visit: Payer: Self-pay | Admitting: Family Medicine

## 2021-12-30 DIAGNOSIS — K219 Gastro-esophageal reflux disease without esophagitis: Secondary | ICD-10-CM

## 2022-01-10 ENCOUNTER — Other Ambulatory Visit: Payer: Self-pay | Admitting: Family Medicine

## 2022-01-10 DIAGNOSIS — R053 Chronic cough: Secondary | ICD-10-CM

## 2022-01-10 DIAGNOSIS — R062 Wheezing: Secondary | ICD-10-CM

## 2022-02-18 ENCOUNTER — Ambulatory Visit (INDEPENDENT_AMBULATORY_CARE_PROVIDER_SITE_OTHER): Payer: Medicare PPO

## 2022-02-18 VITALS — Ht 65.0 in | Wt 169.0 lb

## 2022-02-18 DIAGNOSIS — Z0000 Encounter for general adult medical examination without abnormal findings: Secondary | ICD-10-CM

## 2022-02-18 DIAGNOSIS — Z780 Asymptomatic menopausal state: Secondary | ICD-10-CM

## 2022-02-18 NOTE — Patient Instructions (Signed)
Alexandria Ellison , Thank you for taking time to come for your Medicare Wellness Visit. I appreciate your ongoing commitment to your health goals. Please review the following plan we discussed and let me know if I can assist you in the future.   These are the goals we discussed:  Goals      LIFESTYLE - DECREASE FALLS RISK        This is a list of the screening recommended for you and due dates:  Health Maintenance  Topic Date Due   DTaP/Tdap/Td vaccine (1 - Tdap) Never done   Zoster (Shingles) Vaccine (1 of 2) Never done   COVID-19 Vaccine (2 - Moderna risk series) 03/10/2020   Flu Shot  05/22/2022*   Medicare Annual Wellness Visit  02/19/2023   Pneumonia Vaccine  Completed   DEXA scan (bone density measurement)  Completed   HPV Vaccine  Aged Out  *Topic was postponed. The date shown is not the original due date.    Advanced directives: Advance directive discussed with you today. I have provided a copy for you to complete at home and have notarized. Once this is complete please bring a copy in to our office so we can scan it into your chart.   Conditions/risks identified: Aim for 30 minutes of exercise or brisk walking, 6-8 glasses of water, and 5 servings of fruits and vegetables each day.   Next appointment: Follow up in one year for your annual wellness visit    Preventive Care 65 Years and Older, Female Preventive care refers to lifestyle choices and visits with your health care provider that can promote health and wellness. What does preventive care include? A yearly physical exam. This is also called an annual well check. Dental exams once or twice a year. Routine eye exams. Ask your health care provider how often you should have your eyes checked. Personal lifestyle choices, including: Daily care of your teeth and gums. Regular physical activity. Eating a healthy diet. Avoiding tobacco and drug use. Limiting alcohol use. Practicing safe sex. Taking low-dose aspirin every  day. Taking vitamin and mineral supplements as recommended by your health care provider. What happens during an annual well check? The services and screenings done by your health care provider during your annual well check will depend on your age, overall health, lifestyle risk factors, and family history of disease. Counseling  Your health care provider may ask you questions about your: Alcohol use. Tobacco use. Drug use. Emotional well-being. Home and relationship well-being. Sexual activity. Eating habits. History of falls. Memory and ability to understand (cognition). Work and work Statistician. Reproductive health. Screening  You may have the following tests or measurements: Height, weight, and BMI. Blood pressure. Lipid and cholesterol levels. These may be checked every 5 years, or more frequently if you are over 52 years old. Skin check. Lung cancer screening. You may have this screening every year starting at age 17 if you have a 30-pack-year history of smoking and currently smoke or have quit within the past 15 years. Fecal occult blood test (FOBT) of the stool. You may have this test every year starting at age 77. Flexible sigmoidoscopy or colonoscopy. You may have a sigmoidoscopy every 5 years or a colonoscopy every 10 years starting at age 38. Hepatitis C blood test. Hepatitis B blood test. Sexually transmitted disease (STD) testing. Diabetes screening. This is done by checking your blood sugar (glucose) after you have not eaten for a while (fasting). You may have this done  every 1-3 years. Bone density scan. This is done to screen for osteoporosis. You may have this done starting at age 41. Mammogram. This may be done every 1-2 years. Talk to your health care provider about how often you should have regular mammograms. Talk with your health care provider about your test results, treatment options, and if necessary, the need for more tests. Vaccines  Your health care  provider may recommend certain vaccines, such as: Influenza vaccine. This is recommended every year. Tetanus, diphtheria, and acellular pertussis (Tdap, Td) vaccine. You may need a Td booster every 10 years. Zoster vaccine. You may need this after age 7. Pneumococcal 13-valent conjugate (PCV13) vaccine. One dose is recommended after age 16. Pneumococcal polysaccharide (PPSV23) vaccine. One dose is recommended after age 75. Talk to your health care provider about which screenings and vaccines you need and how often you need them. This information is not intended to replace advice given to you by your health care provider. Make sure you discuss any questions you have with your health care provider. Document Released: 03/06/2015 Document Revised: 10/28/2015 Document Reviewed: 12/09/2014 Elsevier Interactive Patient Education  2017 [place] Prevention in the Home Falls can cause injuries. They can happen to people of all ages. There are many things you can do to make your home safe and to help prevent falls. What can I do on the outside of my home? Regularly fix the edges of walkways and driveways and fix any cracks. Remove anything that might make you trip as you walk through a door, such as a raised step or threshold. Trim any bushes or trees on the path to your home. Use bright outdoor lighting. Clear any walking paths of anything that might make someone trip, such as rocks or tools. Regularly check to see if handrails are loose or broken. Make sure that both sides of any steps have handrails. Any raised decks and porches should have guardrails on the edges. Have any leaves, snow, or ice cleared regularly. Use sand or salt on walking paths during winter. Clean up any spills in your garage right away. This includes oil or grease spills. What can I do in the bathroom? Use night lights. Install grab bars by the toilet and in the tub and shower. Do not use towel bars as grab  bars. Use non-skid mats or decals in the tub or shower. If you need to sit down in the shower, use a plastic, non-slip stool. Keep the floor dry. Clean up any water that spills on the floor as soon as it happens. Remove soap buildup in the tub or shower regularly. Attach bath mats securely with double-sided non-slip rug tape. Do not have throw rugs and other things on the floor that can make you trip. What can I do in the bedroom? Use night lights. Make sure that you have a light by your bed that is easy to reach. Do not use any sheets or blankets that are too big for your bed. They should not hang down onto the floor. Have a firm chair that has side arms. You can use this for support while you get dressed. Do not have throw rugs and other things on the floor that can make you trip. What can I do in the kitchen? Clean up any spills right away. Avoid walking on wet floors. Keep items that you use a lot in easy-to-reach places. If you need to reach something above you, use a strong step stool that has  a grab bar. Keep electrical cords out of the way. Do not use floor polish or wax that makes floors slippery. If you must use wax, use non-skid floor wax. Do not have throw rugs and other things on the floor that can make you trip. What can I do with my stairs? Do not leave any items on the stairs. Make sure that there are handrails on both sides of the stairs and use them. Fix handrails that are broken or loose. Make sure that handrails are as long as the stairways. Check any carpeting to make sure that it is firmly attached to the stairs. Fix any carpet that is loose or worn. Avoid having throw rugs at the top or bottom of the stairs. If you do have throw rugs, attach them to the floor with carpet tape. Make sure that you have a light switch at the top of the stairs and the bottom of the stairs. If you do not have them, ask someone to add them for you. What else can I do to help prevent  falls? Wear shoes that: Do not have high heels. Have rubber bottoms. Are comfortable and fit you well. Are closed at the toe. Do not wear sandals. If you use a stepladder: Make sure that it is fully opened. Do not climb a closed stepladder. Make sure that both sides of the stepladder are locked into place. Ask someone to hold it for you, if possible. Clearly mark and make sure that you can see: Any grab bars or handrails. First and last steps. Where the edge of each step is. Use tools that help you move around (mobility aids) if they are needed. These include: Canes. Walkers. Scooters. Crutches. Turn on the lights when you go into a dark area. Replace any light bulbs as soon as they burn out. Set up your furniture so you have a clear path. Avoid moving your furniture around. If any of your floors are uneven, fix them. If there are any pets around you, be aware of where they are. Review your medicines with your doctor. Some medicines can make you feel dizzy. This can increase your chance of falling. Ask your doctor what other things that you can do to help prevent falls. This information is not intended to replace advice given to you by your health care provider. Make sure you discuss any questions you have with your health care provider. Document Released: 12/04/2008 Document Revised: 07/16/2015 Document Reviewed: 03/14/2014 Elsevier Interactive Patient Education  2017 [employer].

## 2022-02-18 NOTE — Progress Notes (Signed)
Subjective:   Alexandria Ellison is a 81 y.o. female who presents for Medicare Annual (Subsequent) preventive examination. I connected with  [name] on 02/18/22 by a audio enabled telemedicine application and verified that I am speaking with the correct person using two identifiers.  Patient Location: Home  Provider Location: Home Office  I discussed the limitations of evaluation and management by telemedicine. The patient expressed understanding and agreed to proceed.  Review of Systems     Cardiac Risk Factors include: advanced age (>41mn, >>29women)     Objective:    Today's Vitals   02/18/22 1444  Weight: 169 lb (76.7 kg)  Height: '5\' 5"'$  (1.651 m)   Body mass index is 28.12 kg/m.     02/18/2022    2:47 PM 02/17/2021    2:58 PM 08/11/2020    7:38 AM 05/20/2020    1:18 PM 05/20/2020    1:17 PM 07/13/2011    8:38 AM  Advanced Directives  Does Patient Have a Medical Advance Directive? No No No No No Patient does not have advance directive;Patient would like information  Would patient like information on creating a medical advance directive? No - Patient declined No - Patient declined Yes (MAU/Ambulatory/Procedural Areas - Information given) No - Patient declined No - Patient declined Advance directive packet given  Pre-existing out of facility DNR order (yellow form or pink MOST form)      No    Current Medications (verified) Outpatient Encounter Medications as of 02/18/2022  Medication Sig   Acetaminophen (TYLENOL ARTHRITIS PAIN PO) Take 500 mg by mouth as needed. Takes 2 of 500 mg at qhs prn   amLODipine (NORVASC) 2.5 MG tablet Take 2.5 mg by mouth every morning.   Cholecalciferol (VITAMIN D3 PO) Take by mouth.   Cyanocobalamin (B-12 PO) Take by mouth.   cycloSPORINE (RESTASIS) 0.05 % ophthalmic emulsion 1 drop 2 (two) times daily.   Emu Oil OIL by Does not apply route. To legs at hs   famotidine (PEPCID) 20 MG tablet Take 20 mg by mouth at bedtime.   fluorouracil  (EFUDEX) 5 % cream Apply topically 2 (two) times daily.   hydrochlorothiazide (HYDRODIURIL) 12.5 MG tablet Take 1 tablet every day by oral route.   levothyroxine (SYNTHROID) 75 MCG tablet Take 75 mcg by mouth daily.   losartan (COZAAR) 100 MG tablet Take 1 tablet by mouth daily.   magnesium oxide (MAG-OX) 400 MG tablet Take 400 mg by mouth daily.   montelukast (SINGULAIR) 10 MG tablet TAKE 1 TABLET BY MOUTH EVERYDAY AT BEDTIME   nystatin-triamcinolone (MYCOLOG II) cream APPLY TO AFFECTED AREA TWICE A DAY IN THE MORNING AND IN THE EVENING   omeprazole (PRILOSEC) 20 MG capsule Take 1 capsule (20 mg total) by mouth daily.   rosuvastatin (CRESTOR) 5 MG tablet Take 5 mg by mouth daily.   vitamin C (ASCORBIC ACID) 500 MG tablet Take 500 mg by mouth daily.   No facility-administered encounter medications on file as of 02/18/2022.    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, basal cell   UTI (urinary tract infection) 04/2020   Wears partial dentures 08/06/2020   upper and lower   Past Surgical History:  Procedure Laterality Date   APPENDECTOMY  1973   COLONOSCOPY  07/13/2011   Procedure: COLONOSCOPY;  Surgeon: Rogene Houston, MD;  Location[facility];  Service: Endoscopy;  Laterality: N/A;  730   COLONOSCOPY W/ POLYPECTOMY     colonscopy  2018   no polyps removed   CYSTOSCOPY WITH BIOPSY N/A 08/11/2020   Procedure: CYSTOSCOPY WITH BIOPSY;  Surgeon: Robley Fries, MD;  Location: [facility];  Service: Urology;   Laterality: N/A;  [place] N/A 08/11/2020   Procedure: CYSTOSCOPY WITH FULGERATION;  Surgeon: Robley Fries, MD;  Location: [facility];  Service: Urology;  Laterality: N/A;   TUBAL LIGATION  1973   UPPER GI ENDOSCOPY     2018   Family History  Problem Relation Age of Onset   Stomach cancer Mother    Colon cancer Father    Hypertension Sister    Cancer Brother        PROSTATE   Hypertension Brother    Asthma Son    Social History   Socioeconomic History   Marital status: Married    Spouse name: Not on file   Number of children: 2   Years of education: Not on file   Highest education level: Some college, no degree  Occupational History   Occupation: retired  Tobacco Use   Smoking status: Former    Types: Cigarettes   Smokeless tobacco: Former   Tobacco comments:    Social smoker for years quit in 1990's  Vaping Use   Vaping Use: Never used  Substance and Sexual Activity   Alcohol use: No   Drug use: No   Sexual activity: Yes    Birth control/protection: Surgical  Other Topics Concern   Not on file  Social History Narrative   Lives part time in [place], [place] Dr Livia Snellen when here, sees Dr [employer] in [place] when living there   Married to [place] Determinants of Health   Financial Resource Strain: [place]  (02/18/2022)   Overall Financial Resource Strain (CARDIA)    Difficulty of Paying Living Expenses: Not hard at all  Food Insecurity: No Food Insecurity (02/18/2022)   Hunger Vital Sign    Worried About Running Out of Food in the Last Year: Never true    [place] in the Last Year: Never true  Transportation Needs: No Transportation Needs (02/18/2022)   PRAPARE - Hydrologist (Medical): No    Lack of Transportation (Non-Medical): No  Physical Activity: Insufficiently Active (02/18/2022)   Exercise Vital Sign    Days of Exercise per  Week: 3 days    Minutes of Exercise per Session: 30 min  Stress: No Stress Concern Present (02/18/2022)   [place]    Feeling of Stress : Not at all  Social Connections: [place] (02/18/2022)   Social Connection and Isolation Panel [NHANES]    Frequency of Communication with Friends and Family: More than three times a week    Frequency of Social Gatherings with Friends and Family:  More than three times a week    Attends Religious Services: More than 4 times per year    Active Member of Clubs or Organizations: Yes    Attends Music therapist: More than 4 times per year    Marital Status: Married    Tobacco Counseling Counseling given: Not Answered Tobacco comments: Social smoker for years quit in 1990's   Clinical Intake:  Pre-visit preparation completed: Yes  Pain : No/denies pain     Nutritional Risks: None Diabetes: No  How often do you need to have someone help you when you read instructions, pamphlets, or other written materials from your doctor or pharmacy?: 1 - Never  Diabetic?no   Interpreter Needed?: No  Information entered by :: Jadene Pierini, LPN   Activities of Daily Living    02/18/2022    2:47 PM  In your present state of health, do you have any difficulty performing the following activities:  Hearing? 0  Vision? 0  Difficulty concentrating or making decisions? 0  Walking or climbing stairs? 0  Dressing or bathing? 0  Doing errands, shopping? 0  Preparing Food and eating ? N  Using the Toilet? N  In the past six months, have you accidently leaked urine? N  Do you have problems with loss of bowel control? N  Managing your Medications? N  Managing your Finances? N  Housekeeping or managing your Housekeeping? N    Patient Care Team: Baruch Gouty, FNP as PCP - General (Family Medicine)  Indicate any recent Medical Services you may have received from  other than [facility] providers in the past year (date may be approximate).     Assessment:   This is a routine wellness examination for Deneka.  Hearing/Vision screen Vision Screening - Comments:: Wears rx glasses - up to date with routine eye exams with  Dr.Lee  Dietary issues and exercise activities discussed: Current Exercise Habits: Home exercise routine, Type of exercise: walking, Time (Minutes): 30, Frequency (Times/Week): 3, Weekly Exercise (Minutes/Week): 90, Exercise limited by: None identified   Goals Addressed             This Visit's Progress    LIFESTYLE - DECREASE FALLS RISK   On track      Depression Screen    02/18/2022    2:46 PM 11/11/2021    9:40 AM 03/10/2021    3:02 PM 02/17/2021    2:44 PM 12/23/2020    3:59 PM 08/19/2020    3:13 PM 07/29/2020    2:17 PM  PHQ 2/9 Scores  PHQ - 2 Score 0 0 0 0 0 0 0  PHQ- 9 Score  0 0 1 1      Fall Risk    02/18/2022    2:44 PM 12/09/2021   11:13 AM 11/11/2021    9:41 AM 03/10/2021    3:02 PM 02/17/2021    2:58 PM  [place] in the past year? 0 0 0 0 1  Number falls in past yr: 0    0  Injury with Fall? 0    0  Risk for fall due to : No Fall Risks    Orthopedic patient  Follow up Falls prevention discussed    Falls prevention discussed    [place]:  Any stairs in or around the home? Yes  If so, are there any without handrails? No  Home free of loose throw rugs in walkways, pet beds,  electrical cords, etc? Yes  Adequate lighting in your home to reduce risk of falls? Yes   ASSISTIVE DEVICES UTILIZED TO PREVENT FALLS:  Life alert? No  Use of a cane, walker or w/c? No  Grab bars in the bathroom? No  Shower chair or bench in shower? No  Elevated toilet seat or a handicapped toilet? No          02/18/2022    2:47 PM 02/17/2021    2:57 PM  6CIT Screen  What Year? 0 points 0 points  What month? 0 points 0 points  What time? 0 points 0 points  Count back from 20 0  points 0 points  Months in reverse 0 points 0 points  Repeat phrase 0 points 2 points  Total Score 0 points 2 points    Immunizations Immunization History  Administered Date(s) Administered   Influenza Whole 01/11/2000, 12/10/2017, 03/13/2019   Influenza,inj,Quad PF,6+ Mos 12/04/2018   Moderna Sars-Covid-2 Vaccination 02/11/2020   PFIZER SARS-COV-2 Pediatric Vaccination 5-50yr 02/11/2020   Pneumococcal Conjugate-13 04/22/2017   Pneumococcal Polysaccharide-23 01/19/2018    TDAP status: Due, Education has been provided regarding the importance of this vaccine. Advised may receive this vaccine at local pharmacy or Health Dept. Aware to provide a copy of the vaccination record if obtained from local pharmacy or Health Dept. Verbalized acceptance and understanding.  Flu Vaccine status: Due, Education has been provided regarding the importance of this vaccine. Advised may receive this vaccine at local pharmacy or Health Dept. Aware to provide a copy of the vaccination record if obtained from local pharmacy or Health Dept. Verbalized acceptance and understanding.  Pneumococcal vaccine status: Up to date  Covid-19 vaccine status: Completed vaccines  Qualifies for Shingles Vaccine? Yes   Zostavax completed No   Shingrix Completed?: No.    Education has been provided regarding the importance of this vaccine. Patient has been advised to call insurance company to determine out of pocket expense if they have not yet received this vaccine. Advised may also receive vaccine at local pharmacy or Health Dept. Verbalized acceptance and understanding.  Screening Tests Health Maintenance  Topic Date Due   DTaP/Tdap/Td (1 - Tdap) Never done   Zoster Vaccines- Shingrix (1 of 2) Never done   COVID-19 Vaccine (2 - Moderna risk series) 03/10/2020   INFLUENZA VACCINE  05/22/2022 (Originally 09/21/2021)   Medicare Annual Wellness (AWV)  02/19/2023   Pneumonia Vaccine 81 Years old  Completed   DEXA SCAN   Completed   HPV VACCINES  Aged Out    Health Maintenance  Health Maintenance Due  Topic Date Due   DTaP/Tdap/Td (1 - Tdap) Never done   Zoster Vaccines- Shingrix (1 of 2) Never done   COVID-19 Vaccine (2 - Moderna risk series) 03/10/2020    Colorectal cancer screening: No longer required.   Mammogram status: No longer required due to age.  Bone Density status: Ordered 02/18/2022. Pt provided with contact info and advised to call to schedule appt.  Lung Cancer Screening: (Low Dose CT Chest recommended if Age 83-80years, 30 pack-year currently smoking OR have quit w/in 15years.) does not qualify.   Lung Cancer Screening Referral: n/a  Additional Screening:  Hepatitis C Screening: does not qualify;   Vision Screening: Recommended annual ophthalmology exams for early detection of glaucoma and other disorders of the eye. Is the patient up to date with their annual eye exam?  Yes  Who is the provider or what is the name of the office  in which the patient attends annual eye exams? Dr.Lee  If pt is not established with a provider, would they like to be referred to a provider to establish care? No .   Dental Screening: Recommended annual dental exams for proper oral hygiene  Community Resource Referral / Chronic Care Management: CRR required this visit?  No   CCM required this visit?  No      Plan:     I have personally reviewed and noted the following in the patient's chart:   Medical and social history Use of alcohol, tobacco or illicit drugs  Current medications and supplements including opioid prescriptions. Patient is not currently taking opioid prescriptions. Functional ability and status Nutritional status Physical activity Advanced directives List of other physicians Hospitalizations, surgeries, and ER visits in previous 12 months Vitals Screenings to include cognitive, depression, and falls Referrals and appointments  In addition, I have reviewed and  discussed with patient certain preventive protocols, quality metrics, and best practice recommendations. A written personalized care plan for preventive services as well as general preventive health recommendations were provided to patient.     Daphane Shepherd, LPN   [id number]   Nurse Notes: none

## 2022-03-08 ENCOUNTER — Encounter: Payer: Self-pay | Admitting: Family Medicine

## 2022-03-08 ENCOUNTER — Ambulatory Visit: Payer: Medicare PPO | Admitting: Family Medicine

## 2022-03-08 VITALS — BP 146/84 | HR 60 | Temp 97.2°F | Ht 65.0 in | Wt 177.4 lb

## 2022-03-08 DIAGNOSIS — R059 Cough, unspecified: Secondary | ICD-10-CM | POA: Diagnosis not present

## 2022-03-08 DIAGNOSIS — M5432 Sciatica, left side: Secondary | ICD-10-CM

## 2022-03-08 MED ORDER — ALBUTEROL SULFATE (2.5 MG/3ML) 0.083% IN NEBU: 2.5000 mg | INHALATION_SOLUTION | Freq: Four times a day (QID) | RESPIRATORY_TRACT | 1 refills | Status: DC | PRN

## 2022-03-08 MED ORDER — PREDNISONE 20 MG PO TABS: 40.0000 mg | ORAL_TABLET | Freq: Every day | ORAL | 0 refills | Status: AC

## 2022-03-08 MED ORDER — ALBUTEROL SULFATE HFA 108 (90 BASE) MCG/ACT IN AERS: 2.0000 | INHALATION_SPRAY | Freq: Four times a day (QID) | RESPIRATORY_TRACT | 0 refills | Status: DC | PRN

## 2022-03-08 NOTE — Progress Notes (Signed)
Subjective:  Patient ID: Alexandria Ellison, female    DOB: Nov 29, 1940, 82 y.o.   MRN: [id number]  Patient Care Team: Baruch Gouty, FNP as PCP - General (Family Medicine)   Chief Complaint:  Nasal Congestion, Cough (X 1 week. Been taking otc mucinex ), and Sciatica (Pain down left leg on and off )   HPI: Alexandria Ellison is a 82 y.o. female presenting on 03/08/2022 for Nasal Congestion, Cough (X 1 week. Been taking otc mucinex ), and Sciatica (Pain down left leg on and off )   Cough This is a new problem. The current episode started in the past 7 days. The problem has been gradually improving. The problem occurs every few minutes. The cough is Productive of sputum. Associated symptoms include myalgias, nasal congestion and wheezing. Pertinent negatives include no chest pain, chills, ear congestion, ear pain, fever, headaches, heartburn, hemoptysis, postnasal drip, rash, rhinorrhea, sore throat, shortness of breath, sweats or weight loss. Nothing aggravates the symptoms. Treatments tried: Mucinex. The treatment provided significant relief.  Leg Pain  The incident occurred 3 to 5 days ago. There was no injury mechanism. The pain is present in the left leg and left hip. The quality of the pain is described as aching and shooting. The pain is mild. The pain has been Intermittent since onset. Associated symptoms include tingling. Pertinent negatives include no inability to bear weight, loss of motion, loss of sensation, muscle weakness or numbness. She reports no foreign bodies present. The symptoms are aggravated by movement. She has tried nothing for the symptoms.    Relevant past medical, surgical, family, and social history reviewed and updated as indicated.  Allergies and medications reviewed and updated. Data reviewed: Chart in Epic.   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, basal cell   UTI (urinary tract infection) 04/2020   Wears partial dentures 08/06/2020   upper and lower    Past Surgical History:  Procedure Laterality Date   APPENDECTOMY  1973   COLONOSCOPY  07/13/2011   Procedure: COLONOSCOPY;  Surgeon: Rogene Houston, MD;  [facility];  Service: Endoscopy;  Laterality: N/A;  730   COLONOSCOPY W/ POLYPECTOMY     colonscopy  2018   no polyps removed   CYSTOSCOPY WITH BIOPSY N/A 08/11/2020   Procedure: CYSTOSCOPY WITH BIOPSY;  Surgeon: Robley Fries, MD;  Location: [facility];  Service: Urology;  Laterality: N/A;  [place] N/A 08/11/2020   Procedure: CYSTOSCOPY WITH FULGERATION;  Surgeon: Robley Fries, MD;  Location: [facility];  Service: Urology;  Laterality: N/A;   TUBAL LIGATION  1973   UPPER GI ENDOSCOPY     2018    Social History   Socioeconomic History   Marital status: Married    Spouse name: Not on file   Number of children: 2   Years of education: Not on file   Highest education  level: Some college, no degree  Occupational History   Occupation: retired  Tobacco Use   Smoking status: Former    Types: Cigarettes   Smokeless tobacco: Former   Tobacco comments:    Social smoker for years quit in 1990's  Vaping Use   Vaping Use: Never used  Substance and Sexual Activity   Alcohol use: No   Drug use: No   Sexual activity: Yes    Birth control/protection: Surgical  Other Topics Concern   Not on file  Social History Narrative   Lives part time in [place], [place] Dr Livia Snellen when here, sees Dr [employer] in [place] when living there   Married to  [place] Determinants of Health   Financial Resource Strain: [place]  (02/18/2022)   Overall Financial Resource Strain (CARDIA)    Difficulty of Paying Living Expenses: Not hard at all  Food Insecurity: No Food Insecurity (02/18/2022)   Hunger Vital Sign    Worried About Running Out of Food in the Last Year: Never true    [place] in the Last Year: Never true  Transportation Needs: No Transportation Needs (02/18/2022)   PRAPARE - Hydrologist (Medical): No    Lack of Transportation (Non-Medical): No  Physical Activity: Insufficiently Active (02/18/2022)   Exercise Vital Sign    Days of Exercise per Week: 3 days    Minutes of Exercise per Session: 30 min  Stress: No Stress Concern Present (02/18/2022)   [place]    Feeling of Stress : Not at all  Social Connections: [place] (02/18/2022)   Social Connection and Isolation Panel [NHANES]    Frequency of Communication with Friends and Family: More than three times a week    Frequency of Social Gatherings with Friends and Family: More than three times a week    Attends Religious Services: More than 4 times per year    Active Member of [employer] or Organizations: Yes    Attends Music therapist: More than 4 times per year    Marital Status: Married  Human resources officer Violence: Not At Risk (02/18/2022)   Humiliation, Afraid, Rape, and Kick questionnaire    Fear of Current or Ex-Partner: No    Emotionally Abused: No    Physically Abused: No    Sexually Abused: No    Outpatient Encounter Medications as of 03/08/2022  Medication Sig   Acetaminophen (TYLENOL ARTHRITIS PAIN PO) Take 500 mg by mouth as needed. Takes 2 of 500 mg at qhs prn   albuterol (PROVENTIL) (2.5 MG/3ML) 0.083% nebulizer solution Take 3 mLs (2.5 mg total) by nebulization every 6 (six) hours as needed for wheezing or shortness of breath.    amLODipine (NORVASC) 2.5 MG tablet Take 2.5 mg by mouth every morning.   Cholecalciferol (VITAMIN D3 PO) Take by mouth.   Cyanocobalamin (B-12 PO) Take by mouth.   cycloSPORINE (RESTASIS) 0.05 % ophthalmic emulsion 1 drop 2 (two) times daily.   Emu Oil OIL by Does not apply route. To legs at hs   famotidine (PEPCID) 20 MG tablet Take 20 mg by mouth at bedtime.   fluorouracil (EFUDEX) 5 % cream Apply topically 2 (two) times daily.   hydrochlorothiazide (HYDRODIURIL) 12.5 MG tablet Take 1 tablet every day by oral route.   levothyroxine (SYNTHROID) 75 MCG tablet Take 75 mcg by mouth daily.  losartan (COZAAR) 100 MG tablet Take 1 tablet by mouth daily.   magnesium oxide (MAG-OX) 400 MG tablet Take 400 mg by mouth daily.   montelukast (SINGULAIR) 10 MG tablet TAKE 1 TABLET BY MOUTH EVERYDAY AT BEDTIME   nystatin-triamcinolone (MYCOLOG II) cream APPLY TO AFFECTED AREA TWICE A DAY IN THE MORNING AND IN THE EVENING   omeprazole (PRILOSEC) 20 MG capsule Take 1 capsule (20 mg total) by mouth daily.   predniSONE (DELTASONE) 20 MG tablet Take 2 tablets (40 mg total) by mouth daily with breakfast for 5 days.   rosuvastatin (CRESTOR) 5 MG tablet Take 5 mg by mouth daily.   vitamin C (ASCORBIC ACID) 500 MG tablet Take 500 mg by mouth daily.   No facility-administered encounter medications on file as of 03/08/2022.    Allergies  Allergen Reactions   Aspirin Palpitations    No reaction with low dose 81 mg aspirin   Phenobarbital Palpitations    Review of Systems  Constitutional:  Negative for activity change, appetite change, chills, diaphoresis, fatigue, fever, unexpected weight change and weight loss.  HENT:  Positive for congestion. Negative for dental problem, drooling, ear discharge, ear pain, facial swelling, hearing loss, mouth sores, nosebleeds, postnasal drip, rhinorrhea, sinus pressure, sinus pain, sneezing, sore throat, tinnitus, trouble swallowing and voice change.   Eyes:  Negative for  photophobia and visual disturbance.  Respiratory:  Positive for cough and wheezing. Negative for apnea, hemoptysis, choking, chest tightness, shortness of breath and stridor.   Cardiovascular:  Negative for chest pain, palpitations and leg swelling.  Gastrointestinal:  Negative for abdominal pain, constipation, diarrhea, heartburn, nausea and vomiting.  Endocrine: Negative for polydipsia, polyphagia and polyuria.  Genitourinary:  Negative for decreased urine volume and difficulty urinating.  Musculoskeletal:  Positive for arthralgias and myalgias. Negative for back pain, gait problem, joint swelling, neck pain and neck stiffness.  Skin:  Negative for rash.  Neurological:  Positive for tingling. Negative for dizziness, tremors, seizures, syncope, facial asymmetry, speech difficulty, weakness, light-headedness, numbness and headaches.  Psychiatric/Behavioral:  Negative for confusion.   All other systems reviewed and are negative.       Objective:  BP (!) 146/84   Pulse 60   Temp (!) 97.2 F (36.2 C) (Temporal)   Ht '5\' 5"'$  (1.651 m)   Wt 177 lb 6.4 oz (80.5 kg)   SpO2 98%   BMI 29.52 kg/m    Wt Readings from Last 3 Encounters:  03/08/22 177 lb 6.4 oz (80.5 kg)  02/18/22 169 lb (76.7 kg)  12/09/21 174 lb (78.9 kg)    Physical Exam Vitals and nursing note reviewed.  Constitutional:      General: She is not in acute distress.    Appearance: Normal appearance. She is well-developed and well-groomed. She is not ill-appearing, toxic-appearing or diaphoretic.  HENT:     Head: Normocephalic and atraumatic.     Jaw: There is normal jaw occlusion.     Right Ear: Hearing, tympanic membrane, ear canal and external ear normal.     Left Ear: Hearing, tympanic membrane, ear canal and external ear normal.     Nose: Nose normal.     Mouth/Throat:     Lips: Pink.     Mouth: Mucous membranes are moist.     Pharynx: Uvula midline.  Eyes:     General: Lids are normal.     Pupils: Pupils are  equal, round, and reactive to light.  Neck:     Thyroid: No thyroid  mass, thyromegaly or thyroid tenderness.     Vascular: No carotid bruit or JVD.     Trachea: Trachea and phonation normal.  Cardiovascular:     Rate and Rhythm: Normal rate and regular rhythm.     Chest Wall: PMI is not displaced.     Pulses: Normal pulses.     Heart sounds: Normal heart sounds. No murmur heard.    No friction rub. No gallop.  Pulmonary:     Effort: Pulmonary effort is normal. No respiratory distress.     Breath sounds: Normal breath sounds. No stridor. No wheezing, rhonchi or rales.  Chest:     Chest wall: No tenderness.  Abdominal:     General: Bowel sounds are normal. There is no abdominal bruit.     Palpations: Abdomen is soft. There is no hepatomegaly or splenomegaly.     Tenderness: There is no left CVA tenderness.  Musculoskeletal:     Cervical back: Normal range of motion and neck supple.     Thoracic back: Normal.     Lumbar back: No swelling, edema, deformity, signs of trauma, lacerations, spasms, tenderness or bony tenderness. Normal range of motion. Positive left straight leg raise test. Negative right straight leg raise test. No scoliosis.     Right hip: Normal.     Left hip: Normal.     Right upper leg: Normal.     Left upper leg: Normal.     Right lower leg: No edema.     Left lower leg: No edema.     Comments: Kyphosis  Lymphadenopathy:     Cervical: No cervical adenopathy.  Skin:    General: Skin is warm and dry.     Capillary Refill: Capillary refill takes less than 2 seconds.     Coloration: Skin is not cyanotic, jaundiced or pale.     Findings: No rash.  Neurological:     General: No focal deficit present.     Mental Status: She is alert and oriented to person, place, and time.     Sensory: Sensation is intact.     Motor: Motor function is intact.     Coordination: Coordination is intact.     Gait: Gait is intact.     Deep Tendon Reflexes: Reflexes are normal and  symmetric.  Psychiatric:        Attention and Perception: Attention and perception normal.        Mood and Affect: Mood and affect normal.        Speech: Speech normal.        Behavior: Behavior normal. Behavior is cooperative.        Thought Content: Thought content normal.        Cognition and Memory: Cognition and memory normal.        Judgment: Judgment normal.     Results for orders placed or performed in visit on 12/09/21  CBC with Differential/Platelet  Result Value Ref Range   WBC 5.1 3.4 - 10.8 x10E3/uL   RBC 4.15 3.77 - 5.28 x10E6/uL   Hemoglobin 12.1 11.1 - 15.9 g/dL   Hematocrit 36.7 34.0 - 46.6 %   MCV 88 79 - 97 fL   MCH 29.2 26.6 - 33.0 pg   MCHC 33.0 31.5 - 35.7 g/dL   RDW 13.7 11.7 - 15.4 %   Platelets 214 150 - 450 x10E3/uL   Neutrophils 62 Not Estab. %   Lymphs 26 Not Estab. %   Monocytes 9 Not Estab. %  Eos 2 Not Estab. %   Basos 1 Not Estab. %   Neutrophils Absolute 3.1 1.4 - 7.0 x10E3/uL   Lymphocytes Absolute 1.3 0.7 - 3.1 x10E3/uL   Monocytes Absolute 0.5 0.1 - 0.9 x10E3/uL   EOS (ABSOLUTE) 0.1 0.0 - 0.4 x10E3/uL   Basophils Absolute 0.1 0.0 - 0.2 x10E3/uL   Immature Granulocytes 0 Not Estab. %   Immature Grans (Abs) 0.0 0.0 - 0.1 x10E3/uL  CMP14+EGFR  Result Value Ref Range   Glucose 83 70 - 99 mg/dL   BUN 21 8 - 27 mg/dL   Creatinine, Ser 1.05 (H) 0.57 - 1.00 mg/dL   eGFR 53 (L) >59 mL/min/1.73   BUN/Creatinine Ratio 20 12 - 28   Sodium 141 134 - 144 mmol/L   Potassium 3.5 3.5 - 5.2 mmol/L   Chloride 102 96 - 106 mmol/L   CO2 24 20 - 29 mmol/L   Calcium 9.3 8.7 - 10.3 mg/dL   Total Protein 6.1 6.0 - 8.5 g/dL   Albumin 4.0 3.7 - 4.7 g/dL   Globulin, Total 2.1 1.5 - 4.5 g/dL   Albumin/Globulin Ratio 1.9 1.2 - 2.2   Bilirubin Total 0.4 0.0 - 1.2 mg/dL   Alkaline Phosphatase 46 44 - 121 IU/L   AST 21 0 - 40 IU/L   ALT 13 0 - 32 IU/L  Thyroid Panel With TSH  Result Value Ref Range   TSH 5.720 (H) 0.450 - 4.500 uIU/mL   T4, Total 7.7 4.5  - 12.0 ug/dL   T3 Uptake Ratio 29 24 - 39 %   Free Thyroxine Index 2.2 1.2 - 4.9  Vitamin B12  Result Value Ref Range   Vitamin B-12 889 232 - 1,245 pg/mL  VITAMIN D 25 Hydroxy (Vit-D Deficiency, Fractures)  Result Value Ref Range   Vit D, 25-Hydroxy 29.7 (L) 30.0 - 100.0 ng/mL       Pertinent labs & imaging results that were available during my care of the patient were reviewed by me and considered in my medical decision making.  Assessment & Plan:  Jinger was seen today for nasal congestion, cough and sciatica.  Diagnoses and all orders for this visit:  Cough in adult No indications of acute bacterial infection. Will burst with steroids and provide with albuterol inhaler as needed for cough/wheezing. Pt aware to report new, worsening, or persistent symptoms. -     predniSONE (DELTASONE) 20 MG tablet; Take 2 tablets (40 mg total) by mouth daily with breakfast for 5 days  Left sided sciatica No red flags concerning for cauda equina syndrome. Medications as prescribed. Aware to report new, worsening, or persistent symptoms.  -     predniSONE (DELTASONE) 20 MG tablet; Take 2 tablets (40 mg total) by mouth daily with breakfast for 5 days.     Continue all other maintenance medications.  Follow up plan: Return if symptoms worsen or fail to improve.   Continue healthy lifestyle choices, including diet (rich in fruits, vegetables, and lean proteins, and low in salt and simple carbohydrates) and exercise (at least 30 minutes of moderate physical activity daily).  Educational handout given for sciatica  The above assessment and management plan was discussed with the patient. The patient verbalized understanding of and has agreed to the management plan. Patient is aware to call the clinic if they develop any new symptoms or if symptoms persist or worsen. Patient is aware when to return to the clinic for a follow-up visit. Patient educated on when it is  appropriate to go to the emergency  department.   Monia Pouch, FNP-C [facility] [phone]

## 2022-04-05 ENCOUNTER — Other Ambulatory Visit: Payer: Self-pay | Admitting: Family Medicine

## 2022-04-05 DIAGNOSIS — R059 Cough, unspecified: Secondary | ICD-10-CM

## 2022-04-06 ENCOUNTER — Telehealth: Payer: Self-pay | Admitting: Family Medicine

## 2022-04-06 DIAGNOSIS — R002 Palpitations: Secondary | ICD-10-CM

## 2022-04-06 NOTE — Telephone Encounter (Signed)
REFERRAL REQUEST Telephone Note  Have you been seen at our office for this problem? NO (Advise that they may need an appointment with their PCP before a referral can be done)  Reason for Referral: Took prednisone for lungs and medication made heart start fluttering and it hadn't stopped. Told patient she needed appt. Referral discussed with patient: NO  Best contact number of patient for referral team: [phone]    Has patient been seen by a specialist for this issue before: NODr. Harrington Challenger  Patient provider preference for referral: Dr. Harrington Challenger Patient location preference for referral: [place]   Patient notified that referrals can take up to a week or longer to process. If they haven't heard anything within a week they should call back and speak with the referral department.

## 2022-04-06 NOTE — Telephone Encounter (Signed)
Patient states she needs a new cardiology since her cardiology in [place] has retired. Would like to see Dorris Carnes at [facility]. Aware referral will be sent.

## 2022-04-20 ENCOUNTER — Ambulatory Visit (HOSPITAL_BASED_OUTPATIENT_CLINIC_OR_DEPARTMENT_OTHER): Payer: Medicare PPO | Admitting: Cardiology

## 2022-04-20 ENCOUNTER — Encounter (HOSPITAL_BASED_OUTPATIENT_CLINIC_OR_DEPARTMENT_OTHER): Payer: Self-pay | Admitting: Cardiology

## 2022-04-20 VITALS — BP 146/80 | HR 62 | Ht 65.0 in | Wt 177.5 lb

## 2022-04-20 DIAGNOSIS — E7800 Pure hypercholesterolemia, unspecified: Secondary | ICD-10-CM | POA: Diagnosis not present

## 2022-04-20 DIAGNOSIS — R002 Palpitations: Secondary | ICD-10-CM | POA: Diagnosis not present

## 2022-04-20 DIAGNOSIS — Z7189 Other specified counseling: Secondary | ICD-10-CM

## 2022-04-20 DIAGNOSIS — I10 Essential (primary) hypertension: Secondary | ICD-10-CM | POA: Diagnosis not present

## 2022-04-20 NOTE — Progress Notes (Signed)
Cardiology Office Note:    Date:  04/20/2022   ID:  Alexandria Ellison, DOB Nov 30, 1940, MRN [id number]  PCP:  Baruch Gouty, FNP  Cardiologist:  Buford Dresser, MD  Referring MD: Baruch Gouty, FNP   CC:  New patient evaluation for palpitations   History of Present Illness:    Alexandria Ellison is a 82 y.o. female with a hx of hypertension, hyperlipidemia, hypothyroidism, CKD III, spontaneous pneumothorax (age 62, left lung), and skin cancer, who is seen as a new consult at the request of Rakes, Connye Burkitt, FNP for the evaluation and management of palpitations.  She was seen by Darla Lesches, FNP on 03/08/2022 and complained of a cough for 7 days. She was given prednisone for and subsequently developed palpitations.  Today, she is accompanied by her husband.   Tachycardia/palpitations: -Initial onset:  After a few days of taking prednisone.   -Frequency/Duration: Frequent while on prednisone. Lately her palpitations have resolved. -Associated symptoms: Fluttering sensations, feeling very nervous. Very bothersome while lying down at night, causing insomnia. She felt other irregular heart beats and notes that her heart would beat fast for a time and then beat slow. Also, she couldn't complete a deep breath to get her heart to relax. More recently feels a strange sensation every once in a while associated with dizziness and presyncopal feelings. -Aggravating/alleviating factors: Tried deep breathing which was ineffective. At the end of last week she took a 1200 mg fish oil supplement and her palpitations resolved.  -Syncope/near syncope: Near syncope, like she may pass out. -Prior cardiac history:  None -Possible medication interactions: She was on prednisone 20 mg BID at the time. Expectorant every 12 hours. 600 mg Tylenol for severe headaches. She stopped all of these but her palpitations continued.  -Caffeine: Drinks tea. Lately she drinks half green tea and half black tea. -Alcohol: None. -OTC  supplements:  Vitamins C, B12, D3, and magnesium. -Comorbidities: Hypertension, hyperlipidemia, CKD III -Labs: TSH, kidney function/electrolytes, CBC reviewed. -Cardiac ROS: She denies any palpitations, chest pain, shortness of breath, or peripheral edema. No lightheadedness, headaches, syncope, orthopnea, or PND.  May have indigestion if she does not take her omeprazole regularly (mostly takes every other day).  She has recurrent skin cancer, mostly on her legs. This is regularly treated.  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, basal cell   UTI (urinary tract infection) 04/2020   Wears partial dentures 08/06/2020   upper and lower    Past Surgical History:  Procedure Laterality Date   APPENDECTOMY  1973   COLONOSCOPY  07/13/2011   Procedure: COLONOSCOPY;  Surgeon: Rogene [place], MD;  [facility];  Service: Endoscopy;  Laterality: N/A;  730   COLONOSCOPY W/ POLYPECTOMY     colonscopy  2018   no polyps removed   CYSTOSCOPY WITH BIOPSY N/A 08/11/2020   Procedure: CYSTOSCOPY WITH BIOPSY;  Surgeon: Robley Fries, MD;  Location: [facility];  Service: Urology;  Laterality: N/A;  [place] N/A 08/11/2020   Procedure: CYSTOSCOPY WITH FULGERATION;  Surgeon: Robley Fries, MD;  Location: [facility];  Service: Urology;  Laterality: N/A;   TUBAL LIGATION  1973   UPPER GI ENDOSCOPY     2018    Current Medications: Current Outpatient Medications on File Prior to Visit  Medication Sig   Acetaminophen (TYLENOL ARTHRITIS  PAIN PO) Take 500 mg by mouth as needed. Takes 2 of 500 mg at qhs prn   albuterol (VENTOLIN HFA) 108 (90 Base) MCG/ACT inhaler TAKE 2 PUFFS BY MOUTH EVERY 6 HOURS AS NEEDED FOR WHEEZE OR SHORTNESS OF BREATH   amLODipine (NORVASC) 2.5 MG tablet Take 2.5 mg by mouth every morning.   Cholecalciferol (VITAMIN D3 PO) Take by mouth.   Cyanocobalamin (B-12 PO) Take by mouth.   cycloSPORINE (RESTASIS) 0.05 % ophthalmic emulsion 1 drop 2 (two) times daily.   hydrochlorothiazide (HYDRODIURIL) 12.5 MG tablet Take 1 tablet every day by oral route.   levothyroxine (SYNTHROID) 75 MCG tablet Take 75 mcg by mouth daily.   losartan (COZAAR) 100 MG tablet Take 1 tablet by mouth daily.   magnesium oxide (MAG-OX) 400 MG tablet Take 400 mg by mouth daily.   montelukast (SINGULAIR) 10 MG tablet TAKE 1 TABLET BY MOUTH EVERYDAY AT BEDTIME   nystatin-triamcinolone (MYCOLOG II) cream APPLY TO AFFECTED AREA TWICE A DAY IN THE MORNING AND IN THE EVENING   omeprazole (PRILOSEC) 20 MG capsule Take 1 capsule (20 mg total) by mouth daily.   rosuvastatin (CRESTOR) 5 MG tablet Take 5 mg by mouth daily.   vitamin C (ASCORBIC ACID) 500 MG tablet Take 500 mg by mouth daily.   No current facility-administered medications on file prior to visit.     Allergies:   Aspirin and Phenobarbital   Social History   Tobacco Use   Smoking status: Former    Types: Cigarettes   Smokeless tobacco: Former   Tobacco comments:    Social smoker for years quit in 1990's  Vaping Use   Vaping Use: Never used  Substance Use Topics   Alcohol use: No   Drug use: No    Family History: family history includes Asthma in her son; Cancer in her brother; Colon cancer in her father; Hypertension in her brother and sister; Stomach cancer in her mother.  ROS:   Please see the history of present illness.  Additional pertinent ROS: Constitutional: Negative for chills, fever, night sweats, unintentional weight loss  HENT: Negative for ear pain and  hearing loss.   Eyes: Negative for loss of vision and eye pain.  Respiratory: Negative for cough, sputum, wheezing.   Cardiovascular: See HPI. Gastrointestinal: Negative for abdominal pain, melena, and hematochezia.  Genitourinary: Negative for dysuria and hematuria.  Musculoskeletal: Negative for falls and myalgias.  Skin: Negative for itching and rash.  Neurological: Negative for focal weakness, focal sensory changes and loss of consciousness.  Endo/Heme/Allergies: Does not bruise/bleed easily.     EKGs/Labs/Other Studies Reviewed:    The following studies were reviewed today:  Renal Artery Dopplers  12/15/2017  [facility]): Impression:   1. This is a normal renal artery duplex exam. There is no evidence of hemodynamically significant stenosis bilaterally.   2. There appears to be multiple cysts in the right kidney with the largest at the mid pole measuring 3.0cm in it's largest dimension.   3. Both renal veins were visualized and found to be patent.  NOTE: This exam was limited due to excessive bowel gas however is suitable for interpretation.   Holter Monitor 06/07/2017  [facility]): IMPRESSION:  1. Unremarkable 48-hour Holter monitor with normal average resting heart rate and diurnal heart rate variability.  2.  Rare asymptomatic premature atrial and premature ventricular contractions.   Nuclear Stress Test  05/31/2017  [facility]): IMPRESSION:   1. Low risk/prognostically normal Lexiscan Cardiolite stress test. There were no significant perfusion defects suggestive of ischemia or scar.   2. Normal systolic motion and regional wall thickening.  3. Left ventricular systolic function is normal with an estimated LVEF of 86%.   Echocardiogram  05/31/2017  [facility]): Summary   1. Overall left ventricular ejection fraction is estimated at 60 to 65%.   2. Normal global left ventricular systolic function.   3. (Grade 1) Mildly abnormal left ventricular  diastolic filling.   4. Mild concentric left ventricular hypertrophy.   5. Moderately dilated left atrium.   6. Mild mitral annular calcification.   7. Elevated left ventricular end diastolic pressure by tissue Doppler.   8. No intracardiac thrombi, mass or vegetations.   EKG:  EKG is personally reviewed.   04/20/2022:  SR at 62 bpm with 1st degree AV block  Recent Labs: 12/09/2021: ALT 13; BUN 21; Creatinine, Ser 1.05; Hemoglobin 12.1; Platelets 214; Potassium 3.5; Sodium 141; TSH 5.720   Recent Lipid Panel    Component Value Date/Time   CHOL 163 09/17/2021 1416   TRIG 136 09/17/2021 1416   HDL 56 09/17/2021 1416   CHOLHDL 2.9 09/17/2021 1416   LDLCALC 83 09/17/2021 1416    Physical Exam:    VS:  BP (!) 146/80 (BP Location: Left Arm, Patient Position: Sitting, Cuff Size: Large)   Pulse 62   Ht '5\' 5"'$  (1.651 m)   Wt 177 lb 8 oz (80.5 kg)   BMI 29.54 kg/m     Wt Readings from Last 3 Encounters:  04/20/22 177 lb 8 oz (80.5 kg)  03/08/22 177 lb 6.4 oz (80.5 kg)  02/18/22 169 lb (76.7 kg)    GEN: Well nourished, well developed in no acute distress HEENT: Normal, moist mucous membranes NECK: No JVD CARDIAC: regular rhythm, normal S1 and S2, no rubs or gallops. No murmur. VASCULAR: Radial and DP pulses 2+ bilaterally. No carotid bruits RESPIRATORY:  Clear to auscultation without rales, wheezing or rhonchi  ABDOMEN: Soft, non-tender, non-distended MUSCULOSKELETAL:  Ambulates independently SKIN: Warm and dry, no edema NEUROLOGIC:  Alert and oriented x 3. No focal neuro deficits noted. PSYCHIATRIC:  Normal affect    ASSESSMENT:    1. Heart palpitations   2. Essential hypertension   3. Pure hypercholesterolemia   4. Cardiac risk counseling   5. Counseling on health promotion and disease prevention    PLAN:    Palpitations -we discussed the potential causes of fast heart rates and palpitations today. Reviewed the normal electrical system of the heart. Reviewed the role  of the sinus node. Reviewed the balance between resting (vagal) tone and fight or flight nervous system input. Reviewed how exercise improves vagal tone and lowers resting heart rate. Reviewed that sinus tachycardia, or elevated sinus rate, is usually secondary to something else in the body. This can include pain, stress, infection, anxiety, hormone imbalance, low blood counts, etc. Discussed that we do not typically treat sinus tachycardia by itself, and instead the focus is on finding what is driving the heart rate and treating that. We discussed that there  can be other rhythm issues, from either the top or bottom of the heart, that are abnormal rhythms. Discussed how we evaluate for these.  -given that palpitations have stopped, will not pursue monitor at this time. If symptoms recur, she will contact us and we will send a monitor at that time -reviewed red flag warning signs that need immediate medical attention  Hypertension -above goal today but reports typically well controlled -continue amlodipine, HCTZ, losartan  Hypercholesterolemia -continue rosuvastatin -no known ASCVD  Cardiac risk counseling and prevention recommendations: -recommend heart healthy/Mediterranean diet, with whole grains, fruits, vegetable, fish, lean meats, nuts, and olive oil. Limit salt. -recommend moderate walking, 3-5 times/week for 30-50 minutes each session. Aim for at least 150 minutes.week. Goal should be pace of 3 miles/hours, or walking 1.5 miles in 30 minutes -recommend avoidance of tobacco products. Avoid excess alcohol. -ASCVD risk score: The ASCVD Risk score (Arnett DK, et al., 2019) failed to calculate for the following reasons:   The 2019 ASCVD risk score is only valid for ages 20 to 2    Plan for follow up: As needed. If her palpitations return she will call and we can send her a cardiac event monitor.  Buford Dresser, MD, PhD, [facility]    Medication  Adjustments/Labs and Tests Ordered: Current medicines are reviewed at length with the patient today.  Concerns regarding medicines are outlined above.   Orders Placed This Encounter  Procedures   EKG 12-Lead   No orders of the defined types were placed in this encounter.  Patient Instructions  Medication Instructions:  Your physician recommends that you continue on your current medications as directed. Please refer to the Current Medication list given to you today.  Labwork: NONE  Testing/Procedures: IF YOUR PALPITATIONS RETURN CALL THE OFFICE AND WILL SEND YOU A 14 DAY MONITOR   Follow-Up: AS NEEDED       I,Mathew Stumpf,acting as a Education administrator for [employer], MD.,have documented all relevant documentation on the behalf of Buford Dresser, MD,as directed by  Buford Dresser, MD while in the presence of Buford Dresser, MD.  I, Buford Dresser, MD, have reviewed all documentation for this visit. The documentation on 04/20/22 for the exam, diagnosis, procedures, and orders are all accurate and complete.   Signed, Buford Dresser, MD PhD 04/20/2022 7:43 PM    [place]

## 2022-04-20 NOTE — Patient Instructions (Signed)
Medication Instructions:  Your physician recommends that you continue on your current medications as directed. Please refer to the Current Medication list given to you today.  Labwork: NONE  Testing/Procedures: IF YOUR PALPITATIONS RETURN CALL THE OFFICE AND WILL SEND YOU A 14 DAY MONITOR   Follow-Up: AS NEEDED

## 2022-05-03 ENCOUNTER — Other Ambulatory Visit: Payer: Self-pay | Admitting: Family Medicine

## 2022-05-03 DIAGNOSIS — R059 Cough, unspecified: Secondary | ICD-10-CM

## 2022-05-26 DIAGNOSIS — Z09 Encounter for follow-up examination after completed treatment for conditions other than malignant neoplasm: Secondary | ICD-10-CM | POA: Diagnosis not present

## 2022-05-26 DIAGNOSIS — L821 Other seborrheic keratosis: Secondary | ICD-10-CM | POA: Diagnosis not present

## 2022-05-26 DIAGNOSIS — L814 Other melanin hyperpigmentation: Secondary | ICD-10-CM | POA: Diagnosis not present

## 2022-05-26 DIAGNOSIS — C44329 Squamous cell carcinoma of skin of other parts of face: Secondary | ICD-10-CM | POA: Diagnosis not present

## 2022-05-26 DIAGNOSIS — L570 Actinic keratosis: Secondary | ICD-10-CM | POA: Diagnosis not present

## 2022-05-26 DIAGNOSIS — L400 Psoriasis vulgaris: Secondary | ICD-10-CM | POA: Diagnosis not present

## 2022-05-26 DIAGNOSIS — Z7189 Other specified counseling: Secondary | ICD-10-CM | POA: Diagnosis not present

## 2022-05-26 DIAGNOSIS — Z872 Personal history of diseases of the skin and subcutaneous tissue: Secondary | ICD-10-CM | POA: Diagnosis not present

## 2022-05-26 DIAGNOSIS — Z85828 Personal history of other malignant neoplasm of skin: Secondary | ICD-10-CM | POA: Diagnosis not present

## 2022-05-26 DIAGNOSIS — Z08 Encounter for follow-up examination after completed treatment for malignant neoplasm: Secondary | ICD-10-CM | POA: Diagnosis not present

## 2022-05-30 DIAGNOSIS — N1831 Chronic kidney disease, stage 3a: Secondary | ICD-10-CM | POA: Diagnosis not present

## 2022-05-30 DIAGNOSIS — N390 Urinary tract infection, site not specified: Secondary | ICD-10-CM | POA: Diagnosis not present

## 2022-05-30 DIAGNOSIS — M1030 Gout due to renal impairment, unspecified site: Secondary | ICD-10-CM | POA: Diagnosis not present

## 2022-06-07 ENCOUNTER — Other Ambulatory Visit: Payer: Self-pay | Admitting: Family Medicine

## 2022-06-07 DIAGNOSIS — R059 Cough, unspecified: Secondary | ICD-10-CM

## 2022-06-27 DIAGNOSIS — B372 Candidiasis of skin and nail: Secondary | ICD-10-CM | POA: Diagnosis not present

## 2022-06-27 DIAGNOSIS — S30861A Insect bite (nonvenomous) of abdominal wall, initial encounter: Secondary | ICD-10-CM | POA: Diagnosis not present

## 2022-06-28 ENCOUNTER — Ambulatory Visit: Payer: Medicare PPO | Admitting: Nurse Practitioner

## 2022-07-04 DIAGNOSIS — C44329 Squamous cell carcinoma of skin of other parts of face: Secondary | ICD-10-CM | POA: Diagnosis not present

## 2022-07-04 DIAGNOSIS — L905 Scar conditions and fibrosis of skin: Secondary | ICD-10-CM | POA: Diagnosis not present

## 2022-07-15 ENCOUNTER — Other Ambulatory Visit: Payer: Self-pay | Admitting: Family Medicine

## 2022-07-15 DIAGNOSIS — R062 Wheezing: Secondary | ICD-10-CM

## 2022-07-15 DIAGNOSIS — R053 Chronic cough: Secondary | ICD-10-CM

## 2022-07-26 DIAGNOSIS — R0602 Shortness of breath: Secondary | ICD-10-CM | POA: Diagnosis not present

## 2022-07-26 DIAGNOSIS — N1831 Chronic kidney disease, stage 3a: Secondary | ICD-10-CM | POA: Diagnosis not present

## 2022-07-26 DIAGNOSIS — E559 Vitamin D deficiency, unspecified: Secondary | ICD-10-CM | POA: Diagnosis not present

## 2022-07-26 DIAGNOSIS — E782 Mixed hyperlipidemia: Secondary | ICD-10-CM | POA: Diagnosis not present

## 2022-07-26 DIAGNOSIS — Z79899 Other long term (current) drug therapy: Secondary | ICD-10-CM | POA: Diagnosis not present

## 2022-07-26 DIAGNOSIS — K219 Gastro-esophageal reflux disease without esophagitis: Secondary | ICD-10-CM | POA: Diagnosis not present

## 2022-07-26 DIAGNOSIS — D649 Anemia, unspecified: Secondary | ICD-10-CM | POA: Diagnosis not present

## 2022-07-26 DIAGNOSIS — E039 Hypothyroidism, unspecified: Secondary | ICD-10-CM | POA: Diagnosis not present

## 2022-07-26 DIAGNOSIS — N2581 Secondary hyperparathyroidism of renal origin: Secondary | ICD-10-CM | POA: Diagnosis not present

## 2022-07-26 DIAGNOSIS — I10 Essential (primary) hypertension: Secondary | ICD-10-CM | POA: Diagnosis not present

## 2022-07-27 ENCOUNTER — Other Ambulatory Visit (HOSPITAL_COMMUNITY): Payer: Self-pay | Admitting: Family Medicine

## 2022-07-27 DIAGNOSIS — R053 Chronic cough: Secondary | ICD-10-CM

## 2022-08-15 ENCOUNTER — Telehealth (HOSPITAL_BASED_OUTPATIENT_CLINIC_OR_DEPARTMENT_OTHER): Payer: Self-pay | Admitting: Cardiology

## 2022-08-15 NOTE — Telephone Encounter (Signed)
Okay to use Dr. Di Kindle 6/27 24h slot at 2:20 PM for sooner evaluation.   Recommend she take it easy today and go ahead and take another 2.5mg  of Amlodipine to help lower BP.   To prevent dizziness: stay well hydrated, make position changes slowly, eat regular meals.  Recommend she check BP once per day and bring log and cuff to next viit.   Alver Sorrow, NP

## 2022-08-15 NOTE — Telephone Encounter (Signed)
Returned call to patient, spoke with husband ( ok per dpr).   Patient states yesterday she was doing dishes in the kitchen and she felt like she was warm and she might pass out, " her head felt real funny, and her stomach was caving in" At the time her BP was 154/87 HR 74. This is the first time that she noticed this, she also notes some chest tightening or pressure during this episode, but has not felt it sense. She states her blood pressure has been running good, minus one episode at the MD office, but she had just gotten some bad news.  Advised patient we would send over to Dr. Cristal Deer and if she wanted to make changes we would call her back, but if not plan to keep appointment on 7/1

## 2022-08-15 NOTE — Telephone Encounter (Signed)
Pt c/o BP issue: STAT if pt c/o blurred vision, one-sided weakness or slurred speech  1. What are your last 5 BP readings? 154/87 pulse 79 yesterday- have not taken it today- patient is still in the bed  2. Are you having any other symptoms (ex. Dizziness, headache, blurred vision, passed out)? Dizzy and felt like she was going to pass out  3. What is your BP issue? High blood pressure- patient's husband wanted her to be seen, made first available appt with [facility] on 08-22-22

## 2022-08-15 NOTE — Telephone Encounter (Signed)
Spoke with patient, she has a CT scheduled for 2:00 pm that day. Scheduled her to be seen 6/28

## 2022-08-15 NOTE — Telephone Encounter (Signed)
Patient is following up requesting to speak with the nurse again. She states she took her BP and it was 160/90 69. She would like to know if she should proceed with daily routine activity or just sit/lay down.

## 2022-08-18 ENCOUNTER — Ambulatory Visit (HOSPITAL_COMMUNITY)
Admission: RE | Admit: 2022-08-18 | Discharge: 2022-08-18 | Disposition: A | Discharge status: Home / Self Care | Payer: Medicare PPO | Source: Ambulatory Visit | Attending: Family Medicine | Admitting: Family Medicine

## 2022-08-18 DIAGNOSIS — R053 Chronic cough: Secondary | ICD-10-CM | POA: Insufficient documentation

## 2022-08-18 DIAGNOSIS — I700 Atherosclerosis of aorta: Secondary | ICD-10-CM | POA: Diagnosis not present

## 2022-08-19 ENCOUNTER — Ambulatory Visit (HOSPITAL_BASED_OUTPATIENT_CLINIC_OR_DEPARTMENT_OTHER): Payer: Medicare PPO | Admitting: Cardiology

## 2022-08-19 ENCOUNTER — Encounter (HOSPITAL_BASED_OUTPATIENT_CLINIC_OR_DEPARTMENT_OTHER): Payer: Self-pay | Admitting: Cardiology

## 2022-08-19 VITALS — BP 128/78 | HR 77 | Ht 65.0 in | Wt 172.4 lb

## 2022-08-19 DIAGNOSIS — R002 Palpitations: Secondary | ICD-10-CM

## 2022-08-19 DIAGNOSIS — E7800 Pure hypercholesterolemia, unspecified: Secondary | ICD-10-CM | POA: Diagnosis not present

## 2022-08-19 DIAGNOSIS — I10 Essential (primary) hypertension: Secondary | ICD-10-CM | POA: Diagnosis not present

## 2022-08-19 DIAGNOSIS — R1010 Upper abdominal pain, unspecified: Secondary | ICD-10-CM

## 2022-08-19 NOTE — Patient Instructions (Signed)
Medication Instructions:  Your physician recommends that you continue on your current medications as directed. Please refer to the Current Medication list given to you today.  *If you need a refill on your cardiac medications before your next appointment, please call your pharmacy*  Follow-Up: At [facility], you and your health needs are our priority.  As part of our continuing mission to provide you with exceptional heart care, we have created designated Provider Care Teams.  These Care Teams include your primary Cardiologist (physician) and Advanced Practice Providers (APPs -  Physician Assistants and Nurse Practitioners) who all work together to provide you with the care you need, when you need it.   Your next appointment:   As needed  Provider:   Jodelle Red, MD

## 2022-08-19 NOTE — Progress Notes (Signed)
Cardiology Office Note:  .   Date:  08/19/2022  ID:  Alexandria Ellison, DOB [birth year], MRN [id number] PCP: Sonny Masters, FNP  [facility] Providers Cardiologist:  Jodelle Red, MD {  History of Present Illness: .   Alexandria Ellison is a 82 y.o. female  with a hx of hypertension, hyperlipidemia, hypothyroidism, CKD III, spontaneous pneumothorax (age 82, left lung), and skin cancer, who is seen for follow up today.   CV history: see summary of outside studies on note from 04/20/22.  Today: Reviewed phone note from 08/15/22. Noted presyncope while standing in the kitchen. BP 154/87, HR 74. Repeat BP slightly higher. Recommended to take extra amlodipine and slotted for urgent visit today.  She tells me that she had about four episodes of abdominal/chest discomfort that day, brief episodes, unclear triggers but self resolved. Felt like her stomach was moving up into her chest. Has not recurred. Had been under a lot of stress with her family at that time. Felt better when she sat down and rested. Was slightly short of breath, felt like she was "rushed."  Noted chills, no fevers. No other recent symptoms.  We checked her BP today, and her home cuff is >10 points higher than the office cuff. Home BP log ranges 120/82-152/96. Most are ~130 systolic. If her cuff is running high, these numbers would absolutely be lower.  ROS: Denies shortness of breath at rest or with normal exertion. No PND, orthopnea, LE edema or unexpected weight gain. No syncope or palpitations. ROS otherwise negative except as noted.   Studies Reviewed: Marland Kitchen    EKG:     not ordered today  Physical Exam:   VS:  BP 128/78   Pulse 77   Ht 5\' 5"  (1.651 m)   Wt 172 lb 6.4 oz (78.2 kg)   SpO2 97%   BMI 28.69 kg/m    Wt Readings from Last 3 Encounters:  08/19/22 172 lb 6.4 oz (78.2 kg)  04/20/22 177 lb 8 oz (80.5 kg)  03/08/22 177 lb 6.4 oz (80.5 kg)    GEN: Well nourished, well developed in no acute distress HEENT:  Normal, moist mucous membranes NECK: No JVD CARDIAC: regular rhythm, normal S1 and S2, no rubs or gallops. No murmur. VASCULAR: Radial and DP pulses 2+ bilaterally. No carotid bruits RESPIRATORY:  Clear to auscultation without rales, wheezing or rhonchi  ABDOMEN: Soft, non-tender, non-distended MUSCULOSKELETAL:  Ambulates independently SKIN: Warm and dry, no edema NEUROLOGIC:  Alert and oriented x 3. No focal neuro deficits noted. PSYCHIATRIC:  Normal affect    ASSESSMENT AND PLAN: .   Chills Lightheadedness Abdominal/chest discomfort -unclear etiology. Occurred while standing. BP was elevated at the time (though home cuff running higher)  Palpitations -given that palpitations have stopped, will not pursue monitor at this time. If symptoms recur, she will contact us and we will send a monitor at that time -reviewed red flag warning signs that need immediate medical attention   Hypertension -at goal today on office cuff. Checked home cuff, running >10 points higher -continue amlodipine, HCTZ, losartan   Hypercholesterolemia -continue rosuvastatin -no known ASCVD  CV risk counseling and prevention -recommend heart healthy/Mediterranean diet, with whole grains, fruits, vegetable, fish, lean meats, nuts, and olive oil. Limit salt. -recommend moderate walking, 3-5 times/week for 30-50 minutes each session. Aim for at least 150 minutes.week. Goal should be pace of 3 miles/hours, or walking 1.5 miles in 30 minutes -recommend avoidance of tobacco products. Avoid excess alcohol.  Dispo: as needed  Signed, Jodelle Red, MD   Jodelle Red, MD, PhD, [facility]  [facility]  [place]  [facility] [address], [address] [place], [place] [address] [phone]

## 2022-08-22 ENCOUNTER — Ambulatory Visit (HOSPITAL_BASED_OUTPATIENT_CLINIC_OR_DEPARTMENT_OTHER): Payer: Medicare PPO | Admitting: Family

## 2022-08-22 ENCOUNTER — Telehealth (HOSPITAL_BASED_OUTPATIENT_CLINIC_OR_DEPARTMENT_OTHER): Payer: Self-pay

## 2022-08-22 NOTE — Telephone Encounter (Signed)
Gillian Shields, NP prepping charts and consulted RN, patient seen 6/28 by Dr. Cristal Deer, does not need appointment today.   Called patient, no answer, will reattempt.

## 2022-08-22 NOTE — Telephone Encounter (Signed)
Spoke with patient's husband, ok per dpr, advised no need for appointment today, he verbalizes understanding and is okay with appointment being cancelled.

## 2022-08-22 NOTE — Progress Notes (Deleted)
  Cardiology Office Note:  .   Date:  08/22/2022  ID:  Alexandria Ellison, DOB 1940-03-04, MRN [id number] PCP: Sonny Masters, FNP  [facility] Providers Cardiologist:  Jodelle Red, MD { Click to update primary MD,subspecialty MD or APP then REFRESH:1}   History of Present Illness: .   Alexandria Ellison is a 82 y.o. female ***  ROS: ***  Studies Reviewed: .        *** Risk Assessment/Calculations:   {Does this patient have ATRIAL FIBRILLATION?[phone]} No BP recorded.  {Refresh Note OR Click here to enter BP  :1}***       Physical Exam:   VS:  There were no vitals taken for this visit.   Wt Readings from Last 3 Encounters:  08/19/22 172 lb 6.4 oz (78.2 kg)  04/20/22 177 lb 8 oz (80.5 kg)  03/08/22 177 lb 6.4 oz (80.5 kg)    GEN: Well nourished, well developed in no acute distress NECK: No JVD; No carotid bruits CARDIAC: ***RRR, no murmurs, rubs, gallops RESPIRATORY:  Clear to auscultation without rales, wheezing or rhonchi  ABDOMEN: Soft, non-tender, non-distended EXTREMITIES:  No edema; No deformity   ASSESSMENT AND PLAN: .   ***    {Are you ordering a CV Procedure (e.g. stress test, cath, DCCV, TEE, etc)?   Press F2        :[id number]}  Dispo: ***  Signed, Alver Sorrow, NP

## 2022-08-23 ENCOUNTER — Encounter (HOSPITAL_BASED_OUTPATIENT_CLINIC_OR_DEPARTMENT_OTHER): Payer: Self-pay | Admitting: Cardiology

## 2022-09-02 ENCOUNTER — Encounter (HOSPITAL_BASED_OUTPATIENT_CLINIC_OR_DEPARTMENT_OTHER): Payer: Medicare PPO

## 2022-09-05 ENCOUNTER — Encounter: Payer: Self-pay | Admitting: *Deleted

## 2022-09-05 ENCOUNTER — Telehealth: Payer: Self-pay | Admitting: Nurse Practitioner

## 2022-09-05 DIAGNOSIS — R053 Chronic cough: Secondary | ICD-10-CM

## 2022-09-05 NOTE — Telephone Encounter (Signed)
Alexandria Ellison- pt is scheduled for PFT tomorrow at [facility]  They are going to cancel if there is no order  Please advise if okay to place order- this was requested by her PCP per our front staff

## 2022-09-05 NOTE — Telephone Encounter (Signed)
Called the pt and there was no answer, no option to leave msg  I have sent her msg via mychart to call for appt with ND  Closing this encounter

## 2022-09-05 NOTE — Telephone Encounter (Signed)
That is fine. Needs to see Dr. Celine Mans after for f/u. Thanks.

## 2022-09-06 ENCOUNTER — Ambulatory Visit (HOSPITAL_COMMUNITY)
Admission: RE | Admit: 2022-09-06 | Discharge: 2022-09-06 | Disposition: A | Discharge status: Home / Self Care | Payer: Medicare PPO | Source: Ambulatory Visit | Attending: Nurse Practitioner | Admitting: Nurse Practitioner

## 2022-09-06 DIAGNOSIS — R053 Chronic cough: Secondary | ICD-10-CM | POA: Diagnosis not present

## 2022-09-06 LAB — PULMONARY FUNCTION TEST
DL/VA % pred: 78 %
DL/VA: 3.15 ml/min/mmHg/L
DLCO unc % pred: 75 %
DLCO unc: 14.48 ml/min/mmHg
FEF 25-75 Post: 1.44 L/sec
FEF 25-75 Pre: 1.26 L/sec
FEF2575-%Change-Post: 14 %
FEF2575-%Pred-Post: 109 %
FEF2575-%Pred-Pre: 96 %
FEV1-%Change-Post: 4 %
FEV1-%Pred-Post: 96 %
FEV1-%Pred-Pre: 92 %
FEV1-Post: 1.87 L
FEV1-Pre: 1.78 L
FEV1FVC-%Change-Post: -3 %
FEV1FVC-%Pred-Pre: 99 %
FEV6-%Change-Post: 5 %
FEV6-%Pred-Post: 105 %
FEV6-%Pred-Pre: 99 %
FEV6-Post: 2.59 L
FEV6-Pre: 2.45 L
FEV6FVC-%Change-Post: -2 %
FEV6FVC-%Pred-Post: 102 %
FEV6FVC-%Pred-Pre: 105 %
FVC-%Change-Post: 8 %
FVC-%Pred-Post: 102 %
FVC-%Pred-Pre: 94 %
FVC-Post: 2.68 L
FVC-Pre: 2.46 L
Post FEV1/FVC ratio: 70 %
Post FEV6/FVC ratio: 97 %
Pre FEV1/FVC ratio: 73 %
Pre FEV6/FVC Ratio: 100 %
RV % pred: 119 %
RV: 2.98 L
TLC % pred: 102 %
TLC: 5.33 L

## 2022-09-06 MED ORDER — ALBUTEROL SULFATE (2.5 MG/3ML) 0.083% IN NEBU
2.5000 mg | INHALATION_SOLUTION | Freq: Once | RESPIRATORY_TRACT | Status: AC
  Administered 2022-09-06: 2.5 mg via RESPIRATORY_TRACT

## 2022-09-07 DIAGNOSIS — D0472 Carcinoma in situ of skin of left lower limb, including hip: Secondary | ICD-10-CM | POA: Diagnosis not present

## 2022-09-07 DIAGNOSIS — D485 Neoplasm of uncertain behavior of skin: Secondary | ICD-10-CM | POA: Diagnosis not present

## 2022-10-15 ENCOUNTER — Other Ambulatory Visit: Payer: Self-pay | Admitting: Family Medicine

## 2022-10-15 DIAGNOSIS — R062 Wheezing: Secondary | ICD-10-CM

## 2022-10-15 DIAGNOSIS — R053 Chronic cough: Secondary | ICD-10-CM

## 2022-10-17 ENCOUNTER — Other Ambulatory Visit: Payer: Self-pay | Admitting: Family Medicine

## 2022-10-17 DIAGNOSIS — H02831 Dermatochalasis of right upper eyelid: Secondary | ICD-10-CM | POA: Diagnosis not present

## 2022-10-17 DIAGNOSIS — R059 Cough, unspecified: Secondary | ICD-10-CM

## 2022-10-17 DIAGNOSIS — H02834 Dermatochalasis of left upper eyelid: Secondary | ICD-10-CM | POA: Diagnosis not present

## 2022-10-17 DIAGNOSIS — K219 Gastro-esophageal reflux disease without esophagitis: Secondary | ICD-10-CM | POA: Diagnosis not present

## 2022-10-17 DIAGNOSIS — J301 Allergic rhinitis due to pollen: Secondary | ICD-10-CM | POA: Diagnosis not present

## 2022-10-17 DIAGNOSIS — R053 Chronic cough: Secondary | ICD-10-CM | POA: Diagnosis not present

## 2022-11-07 DIAGNOSIS — C44729 Squamous cell carcinoma of skin of left lower limb, including hip: Secondary | ICD-10-CM | POA: Diagnosis not present

## 2022-11-10 ENCOUNTER — Other Ambulatory Visit: Payer: Self-pay | Admitting: Family Medicine

## 2022-11-10 DIAGNOSIS — R059 Cough, unspecified: Secondary | ICD-10-CM

## 2022-11-24 DIAGNOSIS — L821 Other seborrheic keratosis: Secondary | ICD-10-CM | POA: Diagnosis not present

## 2022-11-24 DIAGNOSIS — C44712 Basal cell carcinoma of skin of right lower limb, including hip: Secondary | ICD-10-CM | POA: Diagnosis not present

## 2022-11-24 DIAGNOSIS — Z7189 Other specified counseling: Secondary | ICD-10-CM | POA: Diagnosis not present

## 2022-11-24 DIAGNOSIS — C44729 Squamous cell carcinoma of skin of left lower limb, including hip: Secondary | ICD-10-CM | POA: Diagnosis not present

## 2022-11-24 DIAGNOSIS — L814 Other melanin hyperpigmentation: Secondary | ICD-10-CM | POA: Diagnosis not present

## 2022-11-24 DIAGNOSIS — L570 Actinic keratosis: Secondary | ICD-10-CM | POA: Diagnosis not present

## 2022-11-24 DIAGNOSIS — D485 Neoplasm of uncertain behavior of skin: Secondary | ICD-10-CM | POA: Diagnosis not present

## 2022-11-24 DIAGNOSIS — Z09 Encounter for follow-up examination after completed treatment for conditions other than malignant neoplasm: Secondary | ICD-10-CM | POA: Diagnosis not present

## 2022-11-24 DIAGNOSIS — Z85828 Personal history of other malignant neoplasm of skin: Secondary | ICD-10-CM | POA: Diagnosis not present

## 2022-11-24 DIAGNOSIS — Z08 Encounter for follow-up examination after completed treatment for malignant neoplasm: Secondary | ICD-10-CM | POA: Diagnosis not present

## 2022-11-24 DIAGNOSIS — Z872 Personal history of diseases of the skin and subcutaneous tissue: Secondary | ICD-10-CM | POA: Diagnosis not present

## 2022-11-25 ENCOUNTER — Other Ambulatory Visit: Payer: Self-pay | Admitting: Family Medicine

## 2022-11-25 DIAGNOSIS — K219 Gastro-esophageal reflux disease without esophagitis: Secondary | ICD-10-CM

## 2022-11-28 ENCOUNTER — Encounter: Payer: Self-pay | Admitting: Family Medicine

## 2022-11-28 NOTE — Telephone Encounter (Signed)
Alexandria Ellison NTBS last chronic FU 03/10/21 NO RF sent to pharmacy last OV greater than a year

## 2022-11-28 NOTE — Telephone Encounter (Signed)
N/A NO VM LETTER MAILED

## 2022-11-29 DIAGNOSIS — N390 Urinary tract infection, site not specified: Secondary | ICD-10-CM | POA: Diagnosis not present

## 2022-11-29 DIAGNOSIS — M1030 Gout due to renal impairment, unspecified site: Secondary | ICD-10-CM | POA: Diagnosis not present

## 2022-11-29 DIAGNOSIS — I10 Essential (primary) hypertension: Secondary | ICD-10-CM | POA: Diagnosis not present

## 2022-11-29 DIAGNOSIS — N178 Other acute kidney failure: Secondary | ICD-10-CM | POA: Diagnosis not present

## 2022-11-29 DIAGNOSIS — N1831 Chronic kidney disease, stage 3a: Secondary | ICD-10-CM | POA: Diagnosis not present

## 2022-12-05 DIAGNOSIS — Z5189 Encounter for other specified aftercare: Secondary | ICD-10-CM | POA: Diagnosis not present

## 2022-12-19 DIAGNOSIS — C44712 Basal cell carcinoma of skin of right lower limb, including hip: Secondary | ICD-10-CM | POA: Diagnosis not present

## 2022-12-19 DIAGNOSIS — C44729 Squamous cell carcinoma of skin of left lower limb, including hip: Secondary | ICD-10-CM | POA: Diagnosis not present

## 2022-12-22 ENCOUNTER — Encounter (HOSPITAL_BASED_OUTPATIENT_CLINIC_OR_DEPARTMENT_OTHER): Payer: Medicare PPO

## 2022-12-27 DIAGNOSIS — D485 Neoplasm of uncertain behavior of skin: Secondary | ICD-10-CM | POA: Diagnosis not present

## 2022-12-27 DIAGNOSIS — L820 Inflamed seborrheic keratosis: Secondary | ICD-10-CM | POA: Diagnosis not present

## 2022-12-27 DIAGNOSIS — C44729 Squamous cell carcinoma of skin of left lower limb, including hip: Secondary | ICD-10-CM | POA: Diagnosis not present

## 2023-01-17 DIAGNOSIS — L929 Granulomatous disorder of the skin and subcutaneous tissue, unspecified: Secondary | ICD-10-CM | POA: Diagnosis not present

## 2023-01-17 DIAGNOSIS — C44712 Basal cell carcinoma of skin of right lower limb, including hip: Secondary | ICD-10-CM | POA: Diagnosis not present

## 2023-02-02 DIAGNOSIS — L929 Granulomatous disorder of the skin and subcutaneous tissue, unspecified: Secondary | ICD-10-CM | POA: Diagnosis not present

## 2023-02-20 DIAGNOSIS — L929 Granulomatous disorder of the skin and subcutaneous tissue, unspecified: Secondary | ICD-10-CM | POA: Diagnosis not present

## 2023-02-20 DIAGNOSIS — B078 Other viral warts: Secondary | ICD-10-CM | POA: Diagnosis not present

## 2023-02-28 ENCOUNTER — Ambulatory Visit: Payer: Medicare PPO

## 2023-03-14 DIAGNOSIS — L84 Corns and callosities: Secondary | ICD-10-CM | POA: Diagnosis not present

## 2023-03-14 DIAGNOSIS — L910 Hypertrophic scar: Secondary | ICD-10-CM | POA: Diagnosis not present

## 2023-03-14 DIAGNOSIS — Z5189 Encounter for other specified aftercare: Secondary | ICD-10-CM | POA: Diagnosis not present

## 2023-03-17 ENCOUNTER — Ambulatory Visit: Payer: Medicare PPO

## 2023-03-30 DIAGNOSIS — D485 Neoplasm of uncertain behavior of skin: Secondary | ICD-10-CM | POA: Diagnosis not present

## 2023-03-30 DIAGNOSIS — L814 Other melanin hyperpigmentation: Secondary | ICD-10-CM | POA: Diagnosis not present

## 2023-03-30 DIAGNOSIS — L570 Actinic keratosis: Secondary | ICD-10-CM | POA: Diagnosis not present

## 2023-03-30 DIAGNOSIS — D2372 Other benign neoplasm of skin of left lower limb, including hip: Secondary | ICD-10-CM | POA: Diagnosis not present

## 2023-03-30 DIAGNOSIS — C44722 Squamous cell carcinoma of skin of right lower limb, including hip: Secondary | ICD-10-CM | POA: Diagnosis not present

## 2023-04-10 DIAGNOSIS — D649 Anemia, unspecified: Secondary | ICD-10-CM | POA: Diagnosis not present

## 2023-04-10 DIAGNOSIS — Z0000 Encounter for general adult medical examination without abnormal findings: Secondary | ICD-10-CM | POA: Diagnosis not present

## 2023-04-10 DIAGNOSIS — E039 Hypothyroidism, unspecified: Secondary | ICD-10-CM | POA: Diagnosis not present

## 2023-04-10 DIAGNOSIS — E782 Mixed hyperlipidemia: Secondary | ICD-10-CM | POA: Diagnosis not present

## 2023-04-10 DIAGNOSIS — N1831 Chronic kidney disease, stage 3a: Secondary | ICD-10-CM | POA: Diagnosis not present

## 2023-04-10 DIAGNOSIS — E559 Vitamin D deficiency, unspecified: Secondary | ICD-10-CM | POA: Diagnosis not present

## 2023-04-10 DIAGNOSIS — K219 Gastro-esophageal reflux disease without esophagitis: Secondary | ICD-10-CM | POA: Diagnosis not present

## 2023-04-10 DIAGNOSIS — I10 Essential (primary) hypertension: Secondary | ICD-10-CM | POA: Diagnosis not present

## 2023-04-10 DIAGNOSIS — Z23 Encounter for immunization: Secondary | ICD-10-CM | POA: Diagnosis not present

## 2023-04-13 DIAGNOSIS — L308 Other specified dermatitis: Secondary | ICD-10-CM | POA: Diagnosis not present

## 2023-04-13 DIAGNOSIS — D485 Neoplasm of uncertain behavior of skin: Secondary | ICD-10-CM | POA: Diagnosis not present

## 2023-04-13 DIAGNOSIS — Z5189 Encounter for other specified aftercare: Secondary | ICD-10-CM | POA: Diagnosis not present

## 2023-04-22 ENCOUNTER — Other Ambulatory Visit: Payer: Self-pay | Admitting: Family Medicine

## 2023-04-22 DIAGNOSIS — R053 Chronic cough: Secondary | ICD-10-CM

## 2023-04-22 DIAGNOSIS — R062 Wheezing: Secondary | ICD-10-CM

## 2023-05-02 DIAGNOSIS — N1831 Chronic kidney disease, stage 3a: Secondary | ICD-10-CM | POA: Diagnosis not present

## 2023-05-09 DIAGNOSIS — C44722 Squamous cell carcinoma of skin of right lower limb, including hip: Secondary | ICD-10-CM | POA: Diagnosis not present

## 2023-05-19 DIAGNOSIS — L570 Actinic keratosis: Secondary | ICD-10-CM | POA: Diagnosis not present

## 2023-05-23 DIAGNOSIS — G4700 Insomnia, unspecified: Secondary | ICD-10-CM | POA: Diagnosis not present

## 2023-06-08 DIAGNOSIS — Z5189 Encounter for other specified aftercare: Secondary | ICD-10-CM | POA: Diagnosis not present

## 2023-06-08 DIAGNOSIS — L578 Other skin changes due to chronic exposure to nonionizing radiation: Secondary | ICD-10-CM | POA: Diagnosis not present

## 2023-06-26 DIAGNOSIS — L570 Actinic keratosis: Secondary | ICD-10-CM | POA: Diagnosis not present

## 2023-07-18 DIAGNOSIS — L570 Actinic keratosis: Secondary | ICD-10-CM | POA: Diagnosis not present

## 2023-07-18 DIAGNOSIS — L819 Disorder of pigmentation, unspecified: Secondary | ICD-10-CM | POA: Diagnosis not present

## 2023-12-22 IMAGING — MG MM DIGITAL SCREENING BILAT W/ TOMO AND CAD
8 series · 8 of 24 positions shown · non-contrast
Comparison: Previous exam(s).

CLINICAL DATA: Screening.

EXAM:
DIGITAL SCREENING BILATERAL MAMMOGRAM WITH TOMOSYNTHESIS AND CAD
TECHNIQUE: Bilateral screening digital craniocaudal and mediolateral oblique
mammograms were obtained. Bilateral screening digital breast
tomosynthesis was performed. The images were evaluated with
computer-aided detection.

[L MLO synth-2D]
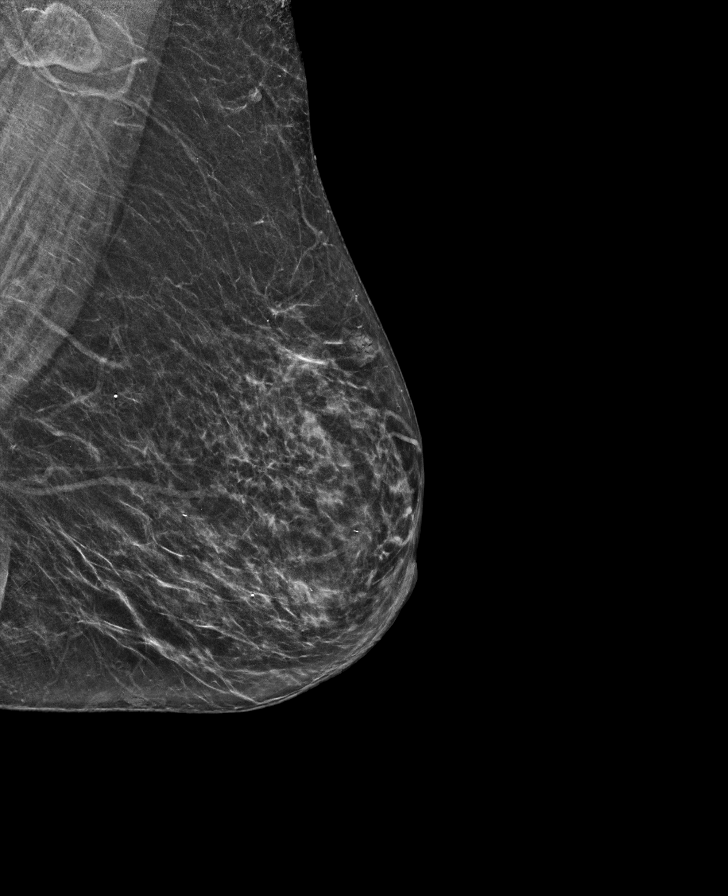

[R MLO synth-2D]
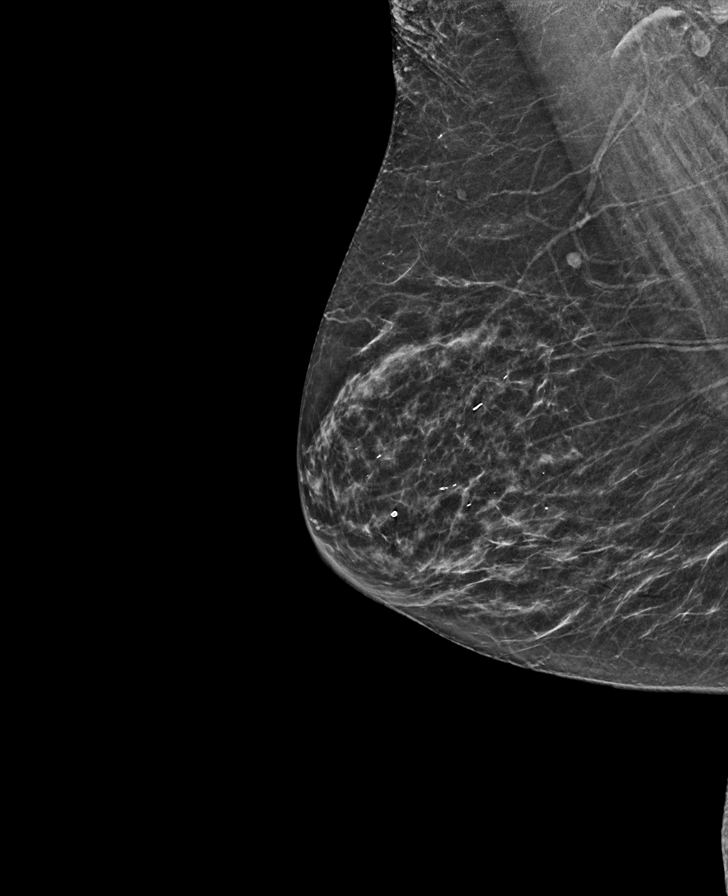

[L CC synth-2D]
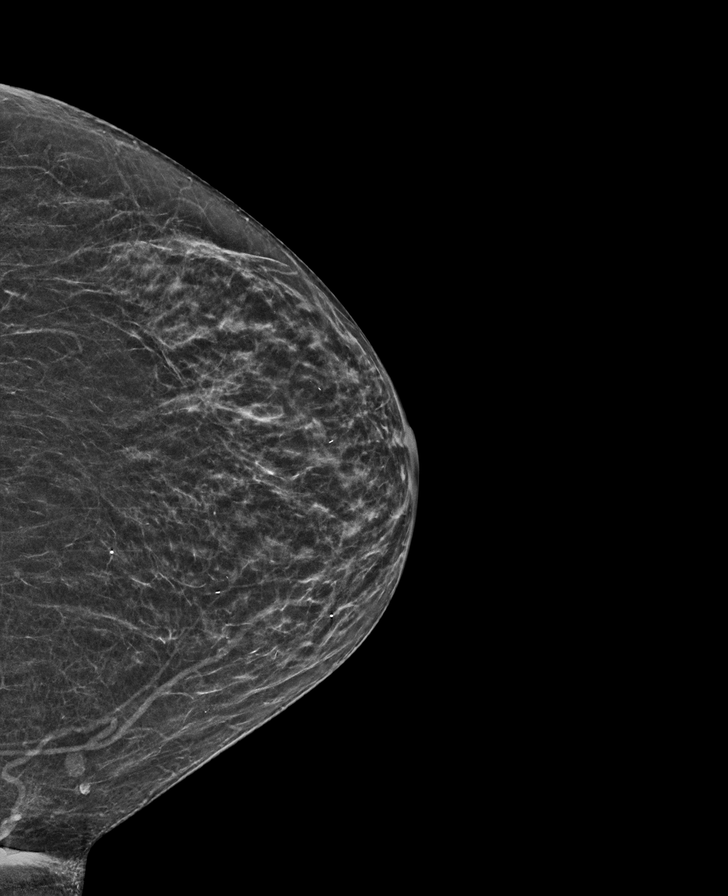

[R CC synth-2D]
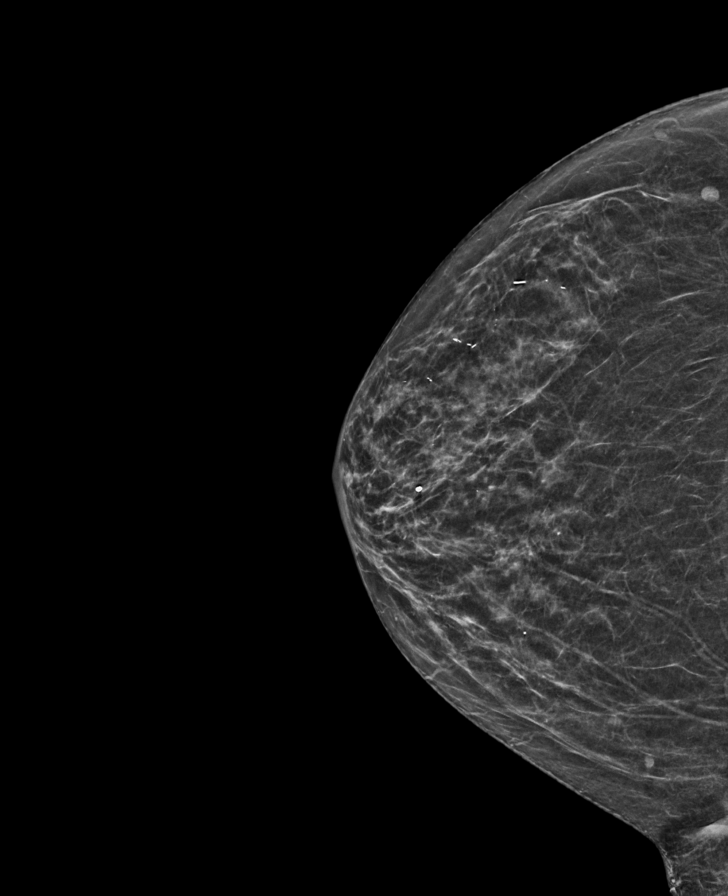

[L CC tomo · tomo slice 26/51.0]
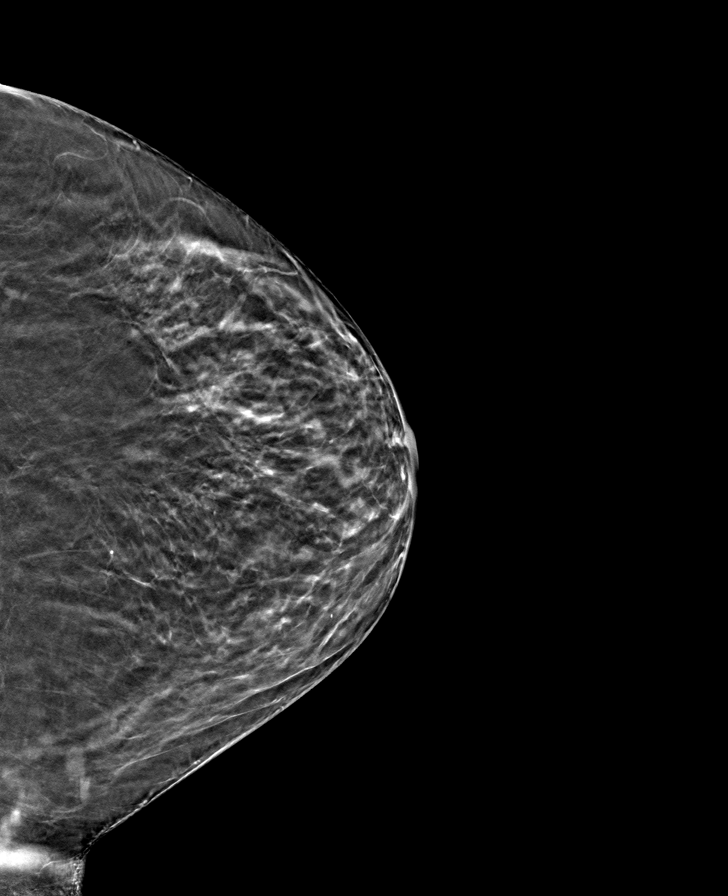

[R CC tomo · tomo slice 25/50.0]
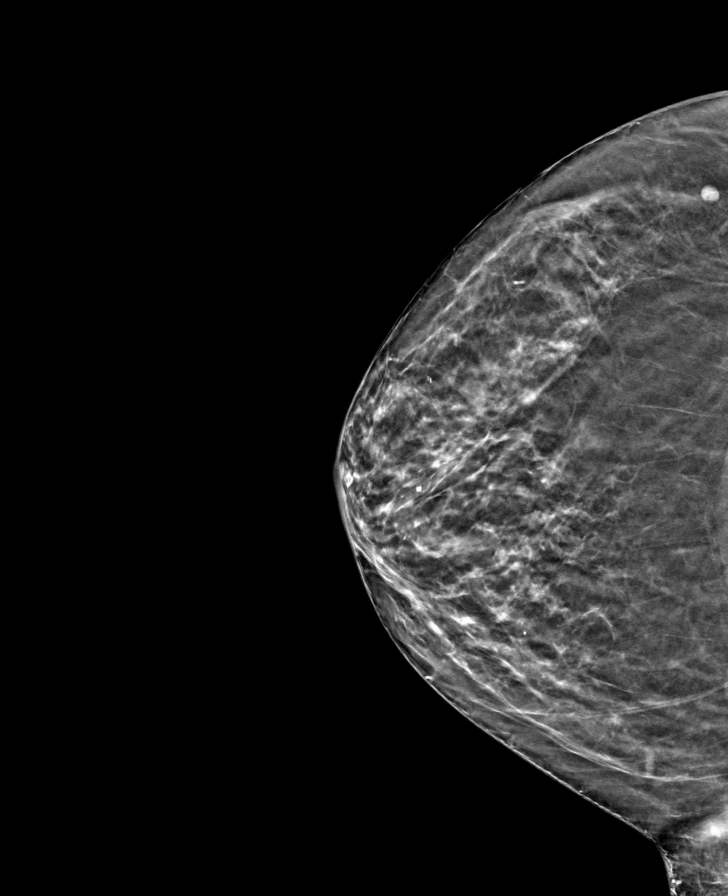

[R MLO tomo · tomo slice 28/55.0]
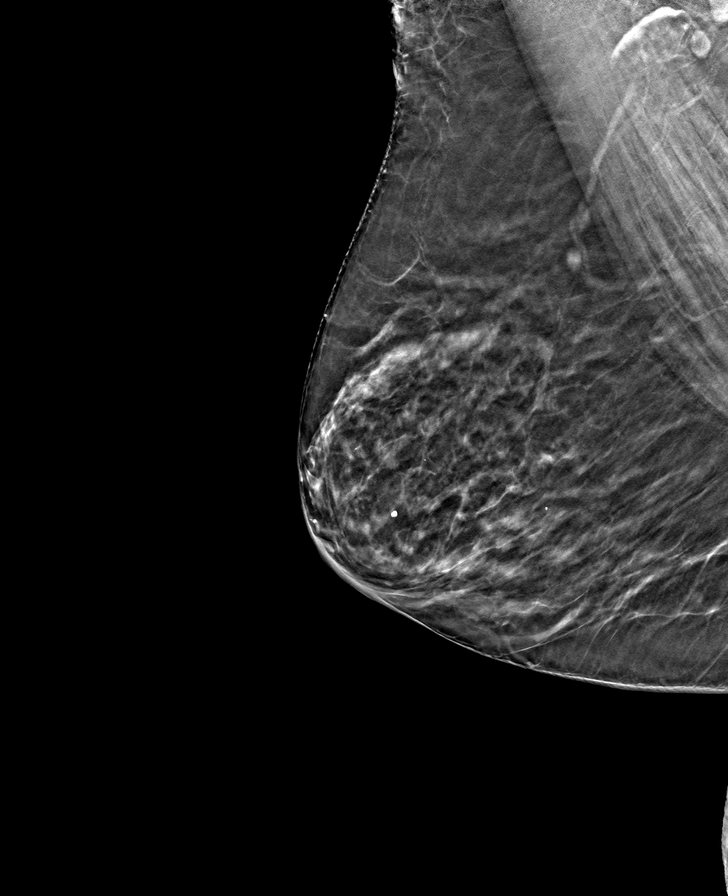

[L MLO tomo · tomo slice 31/61.0]
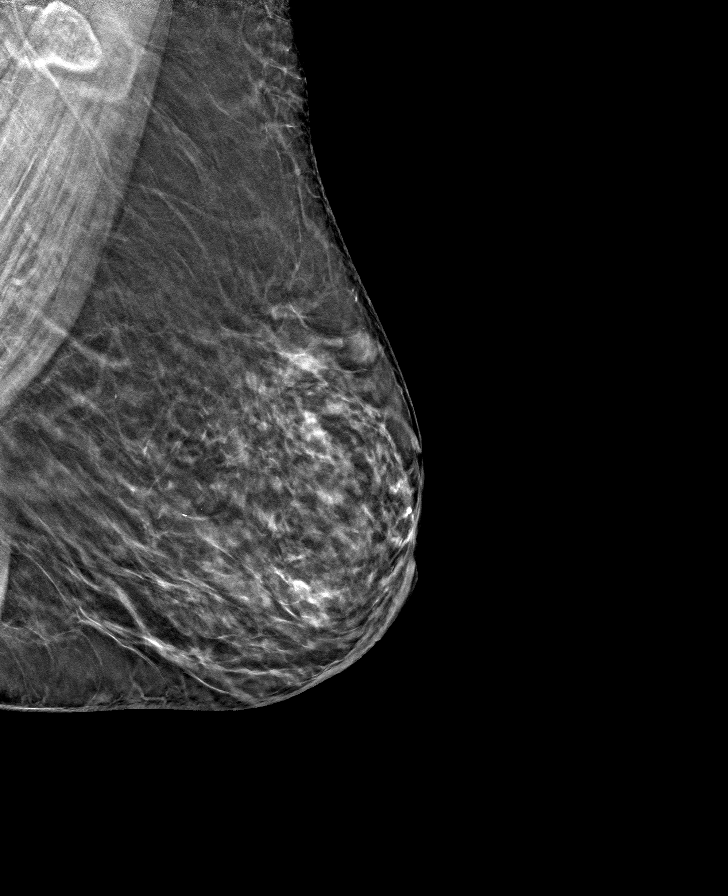

[8 of 24 positions shown; findings below may reference images not displayed]

ACR Breast Density Category b: There are scattered areas of
fibroglandular density.
FINDINGS: There are no findings suspicious for malignancy.
IMPRESSION: No mammographic evidence of malignancy. A result letter of this
screening mammogram will be mailed directly to the patient.

RECOMMENDATION:
Screening mammogram in one year. (Code:51-O-LD2)

BI-RADS CATEGORY  1: Negative.

## 2024-03-13 ENCOUNTER — Ambulatory Visit: Payer: Self-pay

## 2024-03-13 NOTE — Telephone Encounter (Signed)
 Attempted to reach pt as cell and home phone numbers, as well as husband Ron's number. No answer and unable to leave VM to notify appt on Friday has been cancelled and pt will need to reach out to her PCP with [facility] to schedule an acute visit as she is no longer established with [facility].

## 2024-03-13 NOTE — Telephone Encounter (Signed)
 Attempted to call pt back to notify she would need to schedule acute visit with her PCP office at [facility]. Called cell x2, VM not set up and unable to leave VM to call back. Called home phone, states please enter your remote access code. Unable to leave VM. Attempted to reach spouse Ron (on [place]), rang continuously without answer, no option to leave VM.  Called [facility] and spoke with CMA Chelsea to notify this RN unable to reach pt after multiple attempts. Ultimately pt no longer established with [facility] PCP and given that she is established with [facility], acute visit for Friday will need to be cancelled and  she will need to schedule at [facility]. Appt cancelled. Will attempt 2 more times to reach pt to notify.

## 2024-03-13 NOTE — Telephone Encounter (Signed)
 She is no longer our patient. She used to be established here with Dr Zollie and was last seen here by Dr Severa for an acute visit 2 years ago. Looking at her chart, she is established with Dr Marty Sauce that's part of [facility]; not [facility]. Patient will need to make an appointment with them. I confirmed this with one of our leaders also.   I tried calling patient to cancel the appointment with us  but patient did not answer when I called [phone]).

## 2024-03-13 NOTE — Telephone Encounter (Signed)
 FYI Only or Action Required?: FYI only for provider: appointment scheduled on 1/23.  Patient was last seen in primary care on 03/08/2022 by Alexandria Rock HERO, FNP.  Called Nurse Triage reporting Nasal Congestion, Wheezing, and Cough.  Symptoms began several months ago.  Interventions attempted: OTC medications: Mucinex  and Prescription medications: Azelastine.  Symptoms are: gradually worsening.  Triage Disposition: See PCP When Office is Open (Within 3 Days)  Patient/caregiver understands and will follow disposition?: Yes   Past 2 months pt with chest congestion, intermittent mild wheezing, poor sleep. No SOB or CP. No fever or cough. Has been getting worse. Scheduled appt with PCP on 1/23. Advised UC or ED for worsening symptoms.    Message from Alexandria Ellison sent at 03/13/2024  1:17 PM EST  Reason for Triage: PT has had Congestion for 2 months/Taking mucinex/Nasel spray not working/Wheezing/Can not sleep   Reason for Disposition  [1] Sinus congestion (pressure, fullness) AND [2] present > 10 days    Chest congestion  Answer Assessment - Initial Assessment Questions 1. ONSET: When did the nasal discharge start?      Denies  2. AMOUNT: How much discharge is there?      Denies  3. COUGH: Do you have a cough? If Yes, ask: Describe the color of your mucus. (e.g., clear, white, yellow, green)     None  4. RESPIRATORY DISTRESS: Describe your breathing.      Denies SOB  5. FEVER: Do you have a fever? If Yes, ask: What is your temperature, how was it measured, and when did it start?     Denies  6. SEVERITY: Overall, how bad are you feeling right now? (e.g., doesn't interfere with normal activities, staying home from school/work, staying in bed)      Mild-moderately poor  7. OTHER SYMPTOMS: Do you have any other symptoms? (e.g., earache, mouth sores, sore throat, wheezing)     Fatigue, chest congestion, intermittent mild wheezing  Protocols used: Common  Cold-A-AH

## 2024-03-14 NOTE — Telephone Encounter (Signed)
Noted  -LS

## 2024-03-15 ENCOUNTER — Ambulatory Visit: Admitting: Family Medicine
